# Patient Record
Sex: Female | Born: 1990 | Race: White | Hispanic: No | Marital: Married | Smoking: Never smoker
Health system: Southern US, Community
[De-identification: ages and names within clinical notes are randomized; demographics above are authoritative.]

## PROBLEM LIST (undated history)

## (undated) DIAGNOSIS — Z8759 Personal history of other complications of pregnancy, childbirth and the puerperium: Secondary | ICD-10-CM

## (undated) DIAGNOSIS — B019 Varicella without complication: Secondary | ICD-10-CM

## (undated) DIAGNOSIS — I10 Essential (primary) hypertension: Secondary | ICD-10-CM

## (undated) DIAGNOSIS — D649 Anemia, unspecified: Secondary | ICD-10-CM

## (undated) DIAGNOSIS — G43909 Migraine, unspecified, not intractable, without status migrainosus: Secondary | ICD-10-CM

## (undated) HISTORY — DX: Varicella without complication: B01.9

## (undated) HISTORY — DX: Essential (primary) hypertension: I10

## (undated) HISTORY — DX: Migraine, unspecified, not intractable, without status migrainosus: G43.909

---
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
## 2016-04-01 LAB — OB RESULTS CONSOLE HIV ANTIBODY (ROUTINE TESTING): HIV: NONREACTIVE

## 2016-04-01 LAB — OB RESULTS CONSOLE RPR: RPR: NONREACTIVE

## 2016-04-01 LAB — OB RESULTS CONSOLE GC/CHLAMYDIA
Chlamydia: NEGATIVE
Gonorrhea: NEGATIVE

## 2016-04-01 LAB — OB RESULTS CONSOLE RUBELLA ANTIBODY, IGM: Rubella: IMMUNE

## 2016-04-01 LAB — OB RESULTS CONSOLE HEPATITIS B SURFACE ANTIGEN: HEP B S AG: NEGATIVE

## 2016-09-30 ENCOUNTER — Encounter (HOSPITAL_COMMUNITY): Payer: Self-pay | Admitting: *Deleted

## 2016-09-30 ENCOUNTER — Observation Stay (HOSPITAL_COMMUNITY)
Admission: AD | Admit: 2016-09-30 | Discharge: 2016-10-01 | Disposition: A | Discharge status: Home / Self Care | Payer: 59 | Source: Ambulatory Visit | Attending: Obstetrics and Gynecology | Admitting: Obstetrics and Gynecology

## 2016-09-30 DIAGNOSIS — O139 Gestational [pregnancy-induced] hypertension without significant proteinuria, unspecified trimester: Secondary | ICD-10-CM | POA: Diagnosis present

## 2016-09-30 DIAGNOSIS — O133 Gestational [pregnancy-induced] hypertension without significant proteinuria, third trimester: Principal | ICD-10-CM

## 2016-09-30 DIAGNOSIS — Z3A34 34 weeks gestation of pregnancy: Secondary | ICD-10-CM | POA: Diagnosis not present

## 2016-09-30 DIAGNOSIS — O169 Unspecified maternal hypertension, unspecified trimester: Secondary | ICD-10-CM

## 2016-09-30 DIAGNOSIS — Z3689 Encounter for other specified antenatal screening: Secondary | ICD-10-CM

## 2016-09-30 HISTORY — DX: Anemia, unspecified: D64.9

## 2016-09-30 LAB — CBC WITH DIFFERENTIAL/PLATELET
Basophils Absolute: 0 10*3/uL (ref 0.0–0.1)
Basophils Relative: 0 %
EOS ABS: 0.2 10*3/uL (ref 0.0–0.7)
EOS PCT: 2 %
HCT: 33.9 % — ABNORMAL LOW (ref 36.0–46.0)
Hemoglobin: 11.7 g/dL — ABNORMAL LOW (ref 12.0–15.0)
LYMPHS ABS: 1.9 10*3/uL (ref 0.7–4.0)
LYMPHS PCT: 21 %
MCH: 28.7 pg (ref 26.0–34.0)
MCHC: 34.5 g/dL (ref 30.0–36.0)
MCV: 83.3 fL (ref 78.0–100.0)
MONO ABS: 0.4 10*3/uL (ref 0.1–1.0)
MONOS PCT: 5 %
Neutro Abs: 6.7 10*3/uL (ref 1.7–7.7)
Neutrophils Relative %: 72 %
Platelets: 279 10*3/uL (ref 150–400)
RBC: 4.07 MIL/uL (ref 3.87–5.11)
RDW: 13 % (ref 11.5–15.5)
WBC: 9.1 10*3/uL (ref 4.0–10.5)

## 2016-09-30 LAB — COMPREHENSIVE METABOLIC PANEL
ALT: 15 U/L (ref 14–54)
ANION GAP: 9 (ref 5–15)
AST: 33 U/L (ref 15–41)
Albumin: 3.1 g/dL — ABNORMAL LOW (ref 3.5–5.0)
Alkaline Phosphatase: 132 U/L — ABNORMAL HIGH (ref 38–126)
BUN: 6 mg/dL (ref 6–20)
CHLORIDE: 105 mmol/L (ref 101–111)
CO2: 20 mmol/L — AB (ref 22–32)
CREATININE: 0.52 mg/dL (ref 0.44–1.00)
Calcium: 9.6 mg/dL (ref 8.9–10.3)
Glucose, Bld: 98 mg/dL (ref 65–99)
Potassium: 4.8 mmol/L (ref 3.5–5.1)
SODIUM: 134 mmol/L — AB (ref 135–145)
Total Bilirubin: 1.4 mg/dL — ABNORMAL HIGH (ref 0.3–1.2)
Total Protein: 6.9 g/dL (ref 6.5–8.1)

## 2016-09-30 LAB — URINALYSIS, ROUTINE W REFLEX MICROSCOPIC
BILIRUBIN URINE: NEGATIVE
GLUCOSE, UA: NEGATIVE mg/dL
HGB URINE DIPSTICK: NEGATIVE
KETONES UR: NEGATIVE mg/dL
NITRITE: NEGATIVE
PROTEIN: NEGATIVE mg/dL
Specific Gravity, Urine: 1.005 (ref 1.005–1.030)
pH: 7 (ref 5.0–8.0)

## 2016-09-30 LAB — PROTEIN / CREATININE RATIO, URINE
CREATININE, URINE: 30 mg/dL
Total Protein, Urine: <6 mg/dL

## 2016-09-30 LAB — OB RESULTS CONSOLE GBS: GBS: NEGATIVE

## 2016-09-30 LAB — ABO/RH: ABO/RH(D): O POS

## 2016-09-30 LAB — TYPE AND SCREEN
ABO/RH(D): O POS
ANTIBODY SCREEN: NEGATIVE

## 2016-09-30 MED ORDER — BETAMETHASONE SOD PHOS & ACET 6 (3-3) MG/ML IJ SUSP
12.0000 mg | Freq: Once | INTRAMUSCULAR | Status: AC
  Administered 2016-09-30: 12 mg via INTRAMUSCULAR
  Filled 2016-09-30: qty 2

## 2016-09-30 MED ORDER — BETAMETHASONE SOD PHOS & ACET 6 (3-3) MG/ML IJ SUSP
12.0000 mg | Freq: Once | INTRAMUSCULAR | Status: AC
  Administered 2016-10-01: 12 mg via INTRAMUSCULAR
  Filled 2016-09-30: qty 2

## 2016-09-30 MED ORDER — PRENATAL MULTIVITAMIN CH
1.0000 | ORAL_TABLET | Freq: Every day | ORAL | Status: DC
  Administered 2016-09-30: 1 via ORAL
  Filled 2016-09-30: qty 1

## 2016-09-30 MED ORDER — CALCIUM CARBONATE ANTACID 500 MG PO CHEW
2.0000 | CHEWABLE_TABLET | ORAL | Status: DC | PRN
  Administered 2016-10-01: 400 mg via ORAL
  Filled 2016-09-30: qty 2

## 2016-09-30 MED ORDER — DOCUSATE SODIUM 100 MG PO CAPS
100.0000 mg | ORAL_CAPSULE | Freq: Every day | ORAL | Status: DC
  Administered 2016-09-30: 100 mg via ORAL
  Filled 2016-09-30 (×2): qty 1

## 2016-09-30 MED ORDER — ACETAMINOPHEN 325 MG PO TABS: 650.0000 mg | ORAL_TABLET | ORAL | Status: DC | PRN

## 2016-09-30 MED ORDER — ZOLPIDEM TARTRATE 5 MG PO TABS: 5.0000 mg | ORAL_TABLET | Freq: Every evening | ORAL | Status: DC | PRN

## 2016-09-30 NOTE — MAU Note (Signed)
Pt was sent from Dr. Renaldo FiddlerAdkins office with BP at work by by onsite work clinic was 148/108, in dr. Isidore Moosffice it was 142/102.  Pt denies headaches or epigastric pain.  Pt states she has blurry vision which is normal for her.  Increased swelling hands, feet, lower legs.  Good fetal movement.  No vaginal bleeding or ROM.

## 2016-09-30 NOTE — Progress Notes (Signed)
Pt transported via w/c with friend to [facility] from 7pm-9pm. - okayed by Dr Renaldo FiddlerAdkins per pt.

## 2016-09-30 NOTE — H&P (Addendum)
Regina Higgins is a 26 y.o. female presenting for admission with elevated BP.  Was evaluated in office today with BP 146/102.  No HA, n/v or RUQ pain.  + FM.  No ctx, vb or lof.   OB History    Gravida Para Term Preterm AB Living   2       1 0   SAB TAB Ectopic Multiple Live Births     1           Past Medical History:  Diagnosis Date  . Anemia    Past Surgical History:  Procedure Laterality Date  . breast lump removed  08/2008   Rt breast   Family History: family history is not on file. Social History:  reports that she has never smoked. She has never used smokeless tobacco. She reports that she does not drink alcohol or use drugs.     Maternal Diabetes: No Genetic Screening: Declined Maternal Ultrasounds/Referrals: Normal Fetal Ultrasounds or other Referrals:  None Maternal Substance Abuse:  No Significant Maternal Medications:  None Significant Maternal Lab Results:  None Other Comments:  None  ROS History   Blood pressure 121/87, pulse 118, temperature 98.1 F (36.7 C), temperature source Oral, resp. rate 20, SpO2 98 %. Exam Physical Exam  Gen - NAD Abd - gravid, NT Ext - NT, minimal edema bilat PV - deferred Prenatal labs: ABO, Rh:  O+ Antibody:  neg Rubella:  imm RPR:   neg HBsAg:   neg HIV:   neg GBS:     Results for orders placed or performed during the hospital encounter of 09/30/16 (from the past 24 hour(s))  Urinalysis, Routine w reflex microscopic     Status: Abnormal   Collection Time: 09/30/16 12:35 PM  Result Value Ref Range   Color, Urine YELLOW YELLOW   APPearance CLOUDY (A) CLEAR   Specific Gravity, Urine 1.005 1.005 - 1.030   pH 7.0 5.0 - 8.0   Glucose, UA NEGATIVE NEGATIVE mg/dL   Hgb urine dipstick NEGATIVE NEGATIVE   Bilirubin Urine NEGATIVE NEGATIVE   Ketones, ur NEGATIVE NEGATIVE mg/dL   Protein, ur NEGATIVE NEGATIVE mg/dL   Nitrite NEGATIVE NEGATIVE   Leukocytes, UA TRACE (A) NEGATIVE   RBC / HPF 0-5 0 - 5 RBC/hpf   WBC,  UA 6-30 0 - 5 WBC/hpf   Bacteria, UA MANY (A) NONE SEEN   Squamous Epithelial / LPF 6-30 (A) NONE SEEN   Mucous PRESENT   Protein / creatinine ratio, urine     Status: None   Collection Time: 09/30/16 12:35 PM  Result Value Ref Range   Creatinine, Urine 30.00 mg/dL   Total Protein, Urine <6 mg/dL   Protein Creatinine Ratio        0.00 - 0.15 mg/mg[Cre]  CBC with Differential     Status: Abnormal   Collection Time: 09/30/16  1:15 PM  Result Value Ref Range   WBC 9.1 4.0 - 10.5 K/uL   RBC 4.07 3.87 - 5.11 MIL/uL   Hemoglobin 11.7 (L) 12.0 - 15.0 g/dL   HCT 16.133.9 (L) 09.636.0 - 04.546.0 %   MCV 83.3 78.0 - 100.0 fL   MCH 28.7 26.0 - 34.0 pg   MCHC 34.5 30.0 - 36.0 g/dL   RDW 40.913.0 81.111.5 - 91.415.5 %   Platelets 279 150 - 400 K/uL   Neutrophils Relative % 72 %   Neutro Abs 6.7 1.7 - 7.7 K/uL   Lymphocytes Relative 21 %   Lymphs Abs 1.9 0.7 -  4.0 K/uL   Monocytes Relative 5 %   Monocytes Absolute 0.4 0.1 - 1.0 K/uL   Eosinophils Relative 2 %   Eosinophils Absolute 0.2 0.0 - 0.7 K/uL   Basophils Relative 0 %   Basophils Absolute 0.0 0.0 - 0.1 K/uL  Comprehensive metabolic panel     Status: Abnormal   Collection Time: 09/30/16  1:15 PM  Result Value Ref Range   Sodium 134 (L) 135 - 145 mmol/L   Potassium 4.8 3.5 - 5.1 mmol/L   Chloride 105 101 - 111 mmol/L   CO2 20 (L) 22 - 32 mmol/L   Glucose, Bld 98 65 - 99 mg/dL   BUN 6 6 - 20 mg/dL   Creatinine, Ser 1.61 0.44 - 1.00 mg/dL   Calcium 9.6 8.9 - 09.6 mg/dL   Total Protein 6.9 6.5 - 8.1 g/dL   Albumin 3.1 (L) 3.5 - 5.0 g/dL   AST 33 15 - 41 U/L   ALT 15 14 - 54 U/L   Alkaline Phosphatase 132 (H) 38 - 126 U/L   Total Bilirubin 1.4 (H) 0.3 - 1.2 mg/dL   GFR calc non Af Amer >60 >60 mL/min   GFR calc Af Amer >60 >60 mL/min   Anion gap 9 5 - 15    Assessment/Plan: 34+1 wks with GHTN, BP mild range Will admit for obs overnight Start BMZ series   Regina Higgins 09/30/2016, 3:00 PM

## 2016-09-30 NOTE — MAU Provider Note (Signed)
History     CSN: [id number]  Arrival date and time: 09/30/16 1229   First Provider Initiated Contact with Patient 09/30/16 1335      Chief Complaint  Patient presents with  . Hypertension   HPI   [name] is a 26 y.o. female G2P0010 @ 34w1dhere in MAU with elevated BP readings. She was seen in the office today because she felt "off". She was told that her BP was high and that she needed to come here for lab work.   She denies abdominal pain /contraction pain. Denies vaginal bleeding  + fetal heart tones   OB History    Gravida Para Term Preterm AB Living   2       1 0   SAB TAB Ectopic Multiple Live Births     1            Past Medical History:  Diagnosis Date  . Anemia     Past Surgical History:  Procedure Laterality Date  . breast lump removed  08/2008   Rt breast    History reviewed. No pertinent family history.  Social History  Substance Use Topics  . Smoking status: Never Smoker  . Smokeless tobacco: Never Used  . Alcohol use No    Allergies: No Known Allergies  No prescriptions prior to admission.   Results for orders placed or performed during the hospital encounter of 09/30/16 (from the past 48 hour(s))  Urinalysis, Routine w reflex microscopic     Status: Abnormal   Collection Time: 09/30/16 12:35 PM  Result Value Ref Range   Color, Urine YELLOW YELLOW   APPearance CLOUDY (A) CLEAR   Specific Gravity, Urine 1.005 1.005 - 1.030   pH 7.0 5.0 - 8.0   Glucose, UA NEGATIVE NEGATIVE mg/dL   Hgb urine dipstick NEGATIVE NEGATIVE   Bilirubin Urine NEGATIVE NEGATIVE   Ketones, ur NEGATIVE NEGATIVE mg/dL   Protein, ur NEGATIVE NEGATIVE mg/dL   Nitrite NEGATIVE NEGATIVE   Leukocytes, UA TRACE (A) NEGATIVE   RBC / HPF 0-5 0 - 5 RBC/hpf   WBC, UA 6-30 0 - 5 WBC/hpf   Bacteria, UA MANY (A) NONE SEEN   Squamous Epithelial / LPF 6-30 (A) NONE SEEN   Mucous PRESENT   Protein / creatinine ratio, urine     Status: None   Collection Time:  09/30/16 12:35 PM  Result Value Ref Range   Creatinine, Urine 30.00 mg/dL   Total Protein, Urine <6 mg/dL    Comment: REPEATED TO VERIFY NO NORMAL RANGE ESTABLISHED FOR THIS TEST    Protein Creatinine Ratio        0.00 - 0.15 mg/mg[Cre]    Comment: RESULT BELOW REPORTABLE RANGE, UNABLE TO CALCULATE.   CBC with Differential     Status: Abnormal   Collection Time: 09/30/16  1:15 PM  Result Value Ref Range   WBC 9.1 4.0 - 10.5 K/uL   RBC 4.07 3.87 - 5.11 MIL/uL   Hemoglobin 11.7 (L) 12.0 - 15.0 g/dL   HCT 33.9 (L) 36.0 - 46.0 %   MCV 83.3 78.0 - 100.0 fL   MCH 28.7 26.0 - 34.0 pg   MCHC 34.5 30.0 - 36.0 g/dL   RDW 13.0 11.5 - 15.5 %   Platelets 279 150 - 400 K/uL   Neutrophils Relative % 72 %   Neutro Abs 6.7 1.7 - 7.7 K/uL   Lymphocytes Relative 21 %   Lymphs Abs 1.9 0.7 - 4.0 K/uL  Monocytes Relative 5 %   Monocytes Absolute 0.4 0.1 - 1.0 K/uL   Eosinophils Relative 2 %   Eosinophils Absolute 0.2 0.0 - 0.7 K/uL   Basophils Relative 0 %   Basophils Absolute 0.0 0.0 - 0.1 K/uL  Comprehensive metabolic panel     Status: Abnormal   Collection Time: 09/30/16  1:15 PM  Result Value Ref Range   Sodium 134 (L) 135 - 145 mmol/L   Potassium 4.8 3.5 - 5.1 mmol/L   Chloride 105 101 - 111 mmol/L   CO2 20 (L) 22 - 32 mmol/L   Glucose, Bld 98 65 - 99 mg/dL   BUN 6 6 - 20 mg/dL   Creatinine, Ser 0.52 0.44 - 1.00 mg/dL   Calcium 9.6 8.9 - 10.3 mg/dL   Total Protein 6.9 6.5 - 8.1 g/dL   Albumin 3.1 (L) 3.5 - 5.0 g/dL   AST 33 15 - 41 U/L   ALT 15 14 - 54 U/L   Alkaline Phosphatase 132 (H) 38 - 126 U/L   Total Bilirubin 1.4 (H) 0.3 - 1.2 mg/dL   GFR calc non Af Amer >60 >60 mL/min   GFR calc Af Amer >60 >60 mL/min    Comment: (NOTE) The eGFR has been calculated using the CKD EPI equation. This calculation has not been validated in all clinical situations. eGFR's persistently <60 mL/min signify possible Chronic Kidney Disease.    Anion gap 9 5 - 15    Review of Systems   Eyes: Positive for visual disturbance (Patient has glasses however feels she is having trouble focusing. No spots ).  Gastrointestinal: Negative for abdominal pain.  Genitourinary: Negative for dysuria, frequency and urgency.  Neurological: Negative for headaches.   Physical Exam   Blood pressure (!) 149/54, pulse 116, temperature 98.1 F (36.7 C), temperature source Oral, resp. rate 20, SpO2 98 %.  Patient Vitals for the past 24 hrs:  BP Temp Temp src Pulse Resp SpO2  09/30/16 1500 (!) 154/95 98.9 F (37.2 C) Oral (!) 115 18 99 %  09/30/16 1428 121/87 - - 118 - -  09/30/16 1413 (!) 149/54 - - 116 - -  09/30/16 1359 141/85 - - 116 - 98 %  09/30/16 1343 141/86 - - 117 - -  09/30/16 1329 - - - (!) 122 - -  09/30/16 1328 137/76 - - 114 - -  09/30/16 1313 139/94 - - (!) 126 - -  09/30/16 1258 149/88 - - 115 - -  09/30/16 1245 149/93 98.1 F (36.7 C) Oral (!) 125 20 98 %    Physical Exam  Constitutional: She is oriented to person, place, and time. She appears well-developed and well-nourished. No distress.  HENT:  Head: Normocephalic.  Eyes: Pupils are equal, round, and reactive to light.  Cardiovascular: Normal rate.   Respiratory: Effort normal and breath sounds normal. No respiratory distress. She has no wheezes.  GI: Soft.  Musculoskeletal: Normal range of motion.  Neurological: She is alert and oriented to person, place, and time. She has normal reflexes. She displays normal reflexes.  Negative clonus   Skin: Skin is warm. She is not diaphoretic.  Psychiatric: Her behavior is normal.   Fetal Tracing: Baseline: 130 bpm  Variability: moderate  Accelerations: 15x15 Decelerations: none Toco: Occasional contraction. Patient denies pain.   MAU Course  Procedures  none  MDM  PIH labs Discussed patient with Dr. Julien Girt @ 1430 Betamethasone   Assessment and Plan   A:  1. Pregnancy-induced  hypertension in third trimester     P:  Admit to [facility] for 24 hour  observation  Betamethasone   Lezlie Lye, NP 09/30/2016 3:17 PM

## 2016-10-01 ENCOUNTER — Encounter (HOSPITAL_COMMUNITY): Payer: Self-pay | Admitting: *Deleted

## 2016-10-01 ENCOUNTER — Observation Stay (HOSPITAL_COMMUNITY): Payer: 59

## 2016-10-01 LAB — COMPREHENSIVE METABOLIC PANEL
ALBUMIN: 2.9 g/dL — AB (ref 3.5–5.0)
ALK PHOS: 123 U/L (ref 38–126)
ALT: 16 U/L (ref 14–54)
AST: 15 U/L (ref 15–41)
Anion gap: 8 (ref 5–15)
BUN: 10 mg/dL (ref 6–20)
CALCIUM: 8.9 mg/dL (ref 8.9–10.3)
CO2: 19 mmol/L — AB (ref 22–32)
CREATININE: 0.48 mg/dL (ref 0.44–1.00)
Chloride: 106 mmol/L (ref 101–111)
GFR calc Af Amer: >60 mL/min (ref >60)
GFR calc non Af Amer: >60 mL/min (ref >60)
GLUCOSE: 142 mg/dL — AB (ref 65–99)
Potassium: 3.9 mmol/L (ref 3.5–5.1)
SODIUM: 133 mmol/L — AB (ref 135–145)
Total Bilirubin: 0.5 mg/dL (ref 0.3–1.2)
Total Protein: 6.1 g/dL — ABNORMAL LOW (ref 6.5–8.1)

## 2016-10-01 LAB — CBC
HCT: 31.8 % — ABNORMAL LOW (ref 36.0–46.0)
HEMOGLOBIN: 11 g/dL — AB (ref 12.0–15.0)
MCH: 28.6 pg (ref 26.0–34.0)
MCHC: 34.6 g/dL (ref 30.0–36.0)
MCV: 82.6 fL (ref 78.0–100.0)
Platelets: 255 10*3/uL (ref 150–400)
RBC: 3.85 MIL/uL — ABNORMAL LOW (ref 3.87–5.11)
RDW: 13.2 % (ref 11.5–15.5)
WBC: 11.6 10*3/uL — ABNORMAL HIGH (ref 4.0–10.5)

## 2016-10-01 LAB — CULTURE, OB URINE: Special Requests: NORMAL

## 2016-10-01 NOTE — Discharge Summary (Signed)
Physician Discharge Summary  Patient ID: Regina Higgins MRN: [id number] DOB/AGE: [birth year] 25 y.o.  Admit date: 09/30/2016 Discharge date: 10/01/2016  Admission Diagnoses:3460w2d, Gest HTN   Discharge Diagnoses:  Active Problems:   Gestational hypertension   Discharged Condition: good  Hospital Course: adm for eva; + BMZ, D/C within 24 hrs with nl PIH  labs  Consults: None  Significant Diagnostic Studies: labs:  Results for orders placed or performed during the hospital encounter of 09/30/16 (from the past 24 hour(s))  Urinalysis, Routine w reflex microscopic     Status: Abnormal   Collection Time: 09/30/16 12:35 PM  Result Value Ref Range   Color, Urine YELLOW YELLOW   APPearance CLOUDY (A) CLEAR   Specific Gravity, Urine 1.005 1.005 - 1.030   pH 7.0 5.0 - 8.0   Glucose, UA NEGATIVE NEGATIVE mg/dL   Hgb urine dipstick NEGATIVE NEGATIVE   Bilirubin Urine NEGATIVE NEGATIVE   Ketones, ur NEGATIVE NEGATIVE mg/dL   Protein, ur NEGATIVE NEGATIVE mg/dL   Nitrite NEGATIVE NEGATIVE   Leukocytes, UA TRACE (A) NEGATIVE   RBC / HPF 0-5 0 - 5 RBC/hpf   WBC, UA 6-30 0 - 5 WBC/hpf   Bacteria, UA MANY (A) NONE SEEN   Squamous Epithelial / LPF 6-30 (A) NONE SEEN   Mucous PRESENT   Protein / creatinine ratio, urine     Status: None   Collection Time: 09/30/16 12:35 PM  Result Value Ref Range   Creatinine, Urine 30.00 mg/dL   Total Protein, Urine <6 mg/dL   Protein Creatinine Ratio        0.00 - 0.15 mg/mg[Cre]  CBC with Differential     Status: Abnormal   Collection Time: 09/30/16  1:15 PM  Result Value Ref Range   WBC 9.1 4.0 - 10.5 K/uL   RBC 4.07 3.87 - 5.11 MIL/uL   Hemoglobin 11.7 (L) 12.0 - 15.0 g/dL   HCT 40.933.9 (L) 81.136.0 - 91.446.0 %   MCV 83.3 78.0 - 100.0 fL   MCH 28.7 26.0 - 34.0 pg   MCHC 34.5 30.0 - 36.0 g/dL   RDW 78.213.0 95.611.5 - 21.315.5 %   Platelets 279 150 - 400 K/uL   Neutrophils Relative % 72 %   Neutro Abs 6.7 1.7 - 7.7 K/uL   Lymphocytes Relative 21 %   Lymphs Abs  1.9 0.7 - 4.0 K/uL   Monocytes Relative 5 %   Monocytes Absolute 0.4 0.1 - 1.0 K/uL   Eosinophils Relative 2 %   Eosinophils Absolute 0.2 0.0 - 0.7 K/uL   Basophils Relative 0 %   Basophils Absolute 0.0 0.0 - 0.1 K/uL  Comprehensive metabolic panel     Status: Abnormal   Collection Time: 09/30/16  1:15 PM  Result Value Ref Range   Sodium 134 (L) 135 - 145 mmol/L   Potassium 4.8 3.5 - 5.1 mmol/L   Chloride 105 101 - 111 mmol/L   CO2 20 (L) 22 - 32 mmol/L   Glucose, Bld 98 65 - 99 mg/dL   BUN 6 6 - 20 mg/dL   Creatinine, Ser 0.860.52 0.44 - 1.00 mg/dL   Calcium 9.6 8.9 - 57.810.3 mg/dL   Total Protein 6.9 6.5 - 8.1 g/dL   Albumin 3.1 (L) 3.5 - 5.0 g/dL   AST 33 15 - 41 U/L   ALT 15 14 - 54 U/L   Alkaline Phosphatase 132 (H) 38 - 126 U/L   Total Bilirubin 1.4 (H) 0.3 - 1.2 mg/dL   GFR  calc non Af Amer >60 >60 mL/min   GFR calc Af Amer >60 >60 mL/min   Anion gap 9 5 - 15  Type and screen [facility]     Status: None   Collection Time: 09/30/16  3:15 PM  Result Value Ref Range   ABO/RH(D) O POS    Antibody Screen NEG    Sample Expiration 10/03/2016   ABO/Rh     Status: None   Collection Time: 09/30/16  3:15 PM  Result Value Ref Range   ABO/RH(D) O POS   CBC     Status: Abnormal   Collection Time: 10/01/16  6:22 AM  Result Value Ref Range   WBC 11.6 (H) 4.0 - 10.5 K/uL   RBC 3.85 (L) 3.87 - 5.11 MIL/uL   Hemoglobin 11.0 (L) 12.0 - 15.0 g/dL   HCT 16.1 (L) 09.6 - 04.5 %   MCV 82.6 78.0 - 100.0 fL   MCH 28.6 26.0 - 34.0 pg   MCHC 34.6 30.0 - 36.0 g/dL   RDW 40.9 81.1 - 91.4 %   Platelets 255 150 - 400 K/uL  Comprehensive metabolic panel     Status: Abnormal   Collection Time: 10/01/16  6:22 AM  Result Value Ref Range   Sodium 133 (L) 135 - 145 mmol/L   Potassium 3.9 3.5 - 5.1 mmol/L   Chloride 106 101 - 111 mmol/L   CO2 19 (L) 22 - 32 mmol/L   Glucose, Bld 142 (H) 65 - 99 mg/dL   BUN 10 6 - 20 mg/dL   Creatinine, Ser 7.82 0.44 - 1.00 mg/dL   Calcium 8.9  8.9 - 95.6 mg/dL   Total Protein 6.1 (L) 6.5 - 8.1 g/dL   Albumin 2.9 (L) 3.5 - 5.0 g/dL   AST 15 15 - 41 U/L   ALT 16 14 - 54 U/L   Alkaline Phosphatase 123 38 - 126 U/L   Total Bilirubin 0.5 0.3 - 1.2 mg/dL   GFR calc non Af Amer >60 >60 mL/min   GFR calc Af Amer >60 >60 mL/min   Anion gap 8 5 - 15    Treatments: IV hydration and BMZ  Discharge Exam: fhr 142  Disposition: Final discharge disposition not confirmed   Allergies as of 10/01/2016   No Known Allergies     Medication List    TAKE these medications   calcium carbonate 500 MG chewable tablet Commonly known as:  TUMS - dosed in mg elemental calcium Chew 1 tablet by mouth 3 (three) times daily as needed for indigestion or heartburn.   prenatal multivitamin Tabs tablet Take 1 tablet by mouth at bedtime.      Follow-up Information    [facility] Follow up in 3 day(s).   Why:  office will call Contact information: [address] [place] [address] [place] [address] [phone]           Signed: Meriel Pica 10/01/2016, 8:49 AM

## 2016-10-01 NOTE — Progress Notes (Signed)
[redacted]w[redacted]d   S// no new c/o O// BP 123/84 (BP Location: Right Arm)   Pulse (!) 101   Temp 97.5 F (36.4 C) (Oral)   Resp 20   Ht 5\' 4"  (1.626 m)   Wt 117.9 kg (260 lb)   SpO2 98%   BMI 44.63 kg/m   Results for orders placed or performed during the hospital encounter of 09/30/16 (from the past 24 hour(s))  Urinalysis, Routine w reflex microscopic     Status: Abnormal   Collection Time: 09/30/16 12:35 PM  Result Value Ref Range   Color, Urine YELLOW YELLOW   APPearance CLOUDY (A) CLEAR   Specific Gravity, Urine 1.005 1.005 - 1.030   pH 7.0 5.0 - 8.0   Glucose, UA NEGATIVE NEGATIVE mg/dL   Hgb urine dipstick NEGATIVE NEGATIVE   Bilirubin Urine NEGATIVE NEGATIVE   Ketones, ur NEGATIVE NEGATIVE mg/dL   Protein, ur NEGATIVE NEGATIVE mg/dL   Nitrite NEGATIVE NEGATIVE   Leukocytes, UA TRACE (A) NEGATIVE   RBC / HPF 0-5 0 - 5 RBC/hpf   WBC, UA 6-30 0 - 5 WBC/hpf   Bacteria, UA MANY (A) NONE SEEN   Squamous Epithelial / LPF 6-30 (A) NONE SEEN   Mucous PRESENT   Protein / creatinine ratio, urine     Status: None   Collection Time: 09/30/16 12:35 PM  Result Value Ref Range   Creatinine, Urine 30.00 mg/dL   Total Protein, Urine <6 mg/dL   Protein Creatinine Ratio        0.00 - 0.15 mg/mg[Cre]  CBC with Differential     Status: Abnormal   Collection Time: 09/30/16  1:15 PM  Result Value Ref Range   WBC 9.1 4.0 - 10.5 K/uL   RBC 4.07 3.87 - 5.11 MIL/uL   Hemoglobin 11.7 (L) 12.0 - 15.0 g/dL   HCT 16.1 (L) 09.6 - 04.5 %   MCV 83.3 78.0 - 100.0 fL   MCH 28.7 26.0 - 34.0 pg   MCHC 34.5 30.0 - 36.0 g/dL   RDW 40.9 81.1 - 91.4 %   Platelets 279 150 - 400 K/uL   Neutrophils Relative % 72 %   Neutro Abs 6.7 1.7 - 7.7 K/uL   Lymphocytes Relative 21 %   Lymphs Abs 1.9 0.7 - 4.0 K/uL   Monocytes Relative 5 %   Monocytes Absolute 0.4 0.1 - 1.0 K/uL   Eosinophils Relative 2 %   Eosinophils Absolute 0.2 0.0 - 0.7 K/uL   Basophils Relative 0 %   Basophils Absolute 0.0 0.0 - 0.1 K/uL   Comprehensive metabolic panel     Status: Abnormal   Collection Time: 09/30/16  1:15 PM  Result Value Ref Range   Sodium 134 (L) 135 - 145 mmol/L   Potassium 4.8 3.5 - 5.1 mmol/L   Chloride 105 101 - 111 mmol/L   CO2 20 (L) 22 - 32 mmol/L   Glucose, Bld 98 65 - 99 mg/dL   BUN 6 6 - 20 mg/dL   Creatinine, Ser 7.82 0.44 - 1.00 mg/dL   Calcium 9.6 8.9 - 95.6 mg/dL   Total Protein 6.9 6.5 - 8.1 g/dL   Albumin 3.1 (L) 3.5 - 5.0 g/dL   AST 33 15 - 41 U/L   ALT 15 14 - 54 U/L   Alkaline Phosphatase 132 (H) 38 - 126 U/L   Total Bilirubin 1.4 (H) 0.3 - 1.2 mg/dL   GFR calc non Af Amer >60 >60 mL/min   GFR calc Af  Amer >60 >60 mL/min   Anion gap 9 5 - 15  Type and screen [facility]     Status: None   Collection Time: 09/30/16  3:15 PM  Result Value Ref Range   ABO/RH(D) O POS    Antibody Screen NEG    Sample Expiration 10/03/2016   ABO/Rh     Status: None   Collection Time: 09/30/16  3:15 PM  Result Value Ref Range   ABO/RH(D) O POS   CBC     Status: Abnormal   Collection Time: 10/01/16  6:22 AM  Result Value Ref Range   WBC 11.6 (H) 4.0 - 10.5 K/uL   RBC 3.85 (L) 3.87 - 5.11 MIL/uL   Hemoglobin 11.0 (L) 12.0 - 15.0 g/dL   HCT 54.031.8 (L) 98.136.0 - 19.146.0 %   MCV 82.6 78.0 - 100.0 fL   MCH 28.6 26.0 - 34.0 pg   MCHC 34.6 30.0 - 36.0 g/dL   RDW 47.813.2 29.511.5 - 62.115.5 %   Platelets 255 150 - 400 K/uL  Comprehensive metabolic panel     Status: Abnormal   Collection Time: 10/01/16  6:22 AM  Result Value Ref Range   Sodium 133 (L) 135 - 145 mmol/L   Potassium 3.9 3.5 - 5.1 mmol/L   Chloride 106 101 - 111 mmol/L   CO2 19 (L) 22 - 32 mmol/L   Glucose, Bld 142 (H) 65 - 99 mg/dL   BUN 10 6 - 20 mg/dL   Creatinine, Ser 3.080.48 0.44 - 1.00 mg/dL   Calcium 8.9 8.9 - 65.710.3 mg/dL   Total Protein 6.1 (L) 6.5 - 8.1 g/dL   Albumin 2.9 (L) 3.5 - 5.0 g/dL   AST 15 15 - 41 U/L   ALT 16 14 - 54 U/L   Alkaline Phosphatase 123 38 - 126 U/L   Total Bilirubin 0.5 0.3 - 1.2 mg/dL   GFR  calc non Af Amer >60 >60 mL/min   GFR calc Af Amer >60 >60 mL/min   Anion gap 8 5 - 15    A+P// 1872w2d             Gest HTN, stable , will plan D/C after second BMZ today, f/u office 2-3 days

## 2016-10-02 NOTE — Progress Notes (Signed)
1600- Pt given discharge instructions and follow-up appointment. Pre-eclampsia s/s reviewed with patient and patient verbalizes understanding.

## 2016-10-14 ENCOUNTER — Inpatient Hospital Stay (HOSPITAL_COMMUNITY)
Admission: AD | Admit: 2016-10-14 | Discharge: 2016-10-19 | DRG: 765 | Disposition: A | Discharge status: Home / Self Care | Payer: 59 | Source: Ambulatory Visit | Attending: Obstetrics and Gynecology | Admitting: Obstetrics and Gynecology

## 2016-10-14 ENCOUNTER — Encounter (HOSPITAL_COMMUNITY): Payer: Self-pay | Admitting: *Deleted

## 2016-10-14 DIAGNOSIS — Z8249 Family history of ischemic heart disease and other diseases of the circulatory system: Secondary | ICD-10-CM

## 2016-10-14 DIAGNOSIS — O133 Gestational [pregnancy-induced] hypertension without significant proteinuria, third trimester: Secondary | ICD-10-CM

## 2016-10-14 DIAGNOSIS — O99214 Obesity complicating childbirth: Secondary | ICD-10-CM | POA: Diagnosis present

## 2016-10-14 DIAGNOSIS — Z3A36 36 weeks gestation of pregnancy: Secondary | ICD-10-CM | POA: Diagnosis not present

## 2016-10-14 DIAGNOSIS — O139 Gestational [pregnancy-induced] hypertension without significant proteinuria, unspecified trimester: Secondary | ICD-10-CM | POA: Diagnosis present

## 2016-10-14 DIAGNOSIS — O1490 Unspecified pre-eclampsia, unspecified trimester: Secondary | ICD-10-CM | POA: Diagnosis present

## 2016-10-14 DIAGNOSIS — O1494 Unspecified pre-eclampsia, complicating childbirth: Principal | ICD-10-CM | POA: Diagnosis present

## 2016-10-14 DIAGNOSIS — Z6841 Body Mass Index (BMI) 40.0 and over, adult: Secondary | ICD-10-CM

## 2016-10-14 DIAGNOSIS — R030 Elevated blood-pressure reading, without diagnosis of hypertension: Secondary | ICD-10-CM | POA: Diagnosis present

## 2016-10-14 LAB — URINALYSIS, ROUTINE W REFLEX MICROSCOPIC
BILIRUBIN URINE: NEGATIVE
GLUCOSE, UA: NEGATIVE mg/dL
Hgb urine dipstick: NEGATIVE
KETONES UR: 5 mg/dL — AB
Leukocytes, UA: NEGATIVE
Nitrite: NEGATIVE
Protein, ur: NEGATIVE mg/dL
SPECIFIC GRAVITY, URINE: 1.016 (ref 1.005–1.030)
pH: 5 (ref 5.0–8.0)

## 2016-10-14 LAB — CBC
HCT: 31.4 % — ABNORMAL LOW (ref 36.0–46.0)
HCT: 34.5 % — ABNORMAL LOW (ref 36.0–46.0)
HEMATOCRIT: 33 % — AB (ref 36.0–46.0)
HEMOGLOBIN: 11.8 g/dL — AB (ref 12.0–15.0)
Hemoglobin: 11.1 g/dL — ABNORMAL LOW (ref 12.0–15.0)
Hemoglobin: 11.9 g/dL — ABNORMAL LOW (ref 12.0–15.0)
MCH: 28.9 pg (ref 26.0–34.0)
MCH: 29.2 pg (ref 26.0–34.0)
MCH: 28.5 pg (ref 26.0–34.0)
MCHC: 35.8 g/dL (ref 30.0–36.0)
MCHC: 35.4 g/dL (ref 30.0–36.0)
MCHC: 34.5 g/dL (ref 30.0–36.0)
MCV: 80.9 fL (ref 78.0–100.0)
MCV: 82.6 fL (ref 78.0–100.0)
MCV: 82.7 fL (ref 78.0–100.0)
PLATELETS: 240 10*3/uL (ref 150–400)
PLATELETS: 263 10*3/uL (ref 150–400)
Platelets: 275 10*3/uL (ref 150–400)
RBC: 4.08 MIL/uL (ref 3.87–5.11)
RBC: 3.8 MIL/uL — ABNORMAL LOW (ref 3.87–5.11)
RBC: 4.17 MIL/uL (ref 3.87–5.11)
RDW: 13.3 % (ref 11.5–15.5)
RDW: 13.3 % (ref 11.5–15.5)
RDW: 13.4 % (ref 11.5–15.5)
WBC: 8.6 10*3/uL (ref 4.0–10.5)
WBC: 9.1 10*3/uL (ref 4.0–10.5)
WBC: 9.9 10*3/uL (ref 4.0–10.5)

## 2016-10-14 LAB — COMPREHENSIVE METABOLIC PANEL
ALK PHOS: 163 U/L — AB (ref 38–126)
ALT: 14 U/L (ref 14–54)
ALT: 15 U/L (ref 14–54)
ANION GAP: 10 (ref 5–15)
AST: 14 U/L — AB (ref 15–41)
AST: 16 U/L (ref 15–41)
Albumin: 2.9 g/dL — ABNORMAL LOW (ref 3.5–5.0)
Albumin: 2.5 g/dL — ABNORMAL LOW (ref 3.5–5.0)
Alkaline Phosphatase: 133 U/L — ABNORMAL HIGH (ref 38–126)
Anion gap: 6 (ref 5–15)
BILIRUBIN TOTAL: 0.4 mg/dL (ref 0.3–1.2)
BUN: 10 mg/dL (ref 6–20)
BUN: 11 mg/dL (ref 6–20)
CALCIUM: 8.2 mg/dL — AB (ref 8.9–10.3)
CALCIUM: 8.9 mg/dL (ref 8.9–10.3)
CO2: 18 mmol/L — ABNORMAL LOW (ref 22–32)
CO2: 21 mmol/L — ABNORMAL LOW (ref 22–32)
CREATININE: 0.58 mg/dL (ref 0.44–1.00)
Chloride: 106 mmol/L (ref 101–111)
Chloride: 106 mmol/L (ref 101–111)
Creatinine, Ser: 0.57 mg/dL (ref 0.44–1.00)
GFR calc Af Amer: >60 mL/min (ref >60)
GLUCOSE: 111 mg/dL — AB (ref 65–99)
Glucose, Bld: 95 mg/dL (ref 65–99)
POTASSIUM: 4 mmol/L (ref 3.5–5.1)
Potassium: 4 mmol/L (ref 3.5–5.1)
Sodium: 134 mmol/L — ABNORMAL LOW (ref 135–145)
Sodium: 133 mmol/L — ABNORMAL LOW (ref 135–145)
TOTAL PROTEIN: 5.7 g/dL — AB (ref 6.5–8.1)
TOTAL PROTEIN: 6.5 g/dL (ref 6.5–8.1)
Total Bilirubin: 0.5 mg/dL (ref 0.3–1.2)

## 2016-10-14 LAB — TYPE AND SCREEN
ABO/RH(D): O POS
ABO/RH(D): O POS
Antibody Screen: NEGATIVE
Antibody Screen: NEGATIVE

## 2016-10-14 LAB — PROTEIN / CREATININE RATIO, URINE
CREATININE, URINE: 119 mg/dL
PROTEIN CREATININE RATIO: 0.06 mg/mg{creat} (ref 0.00–0.15)
TOTAL PROTEIN, URINE: 7 mg/dL

## 2016-10-14 MED ORDER — MAGNESIUM SULFATE BOLUS VIA INFUSION
4.0000 g | Freq: Once | INTRAVENOUS | Status: AC
Start: 2016-10-14 — End: 2016-10-14
  Administered 2016-10-14: 4 g via INTRAVENOUS
  Filled 2016-10-14: qty 500

## 2016-10-14 MED ORDER — OXYCODONE-ACETAMINOPHEN 5-325 MG PO TABS: 2.0000 | ORAL_TABLET | ORAL | Status: DC | PRN

## 2016-10-14 MED ORDER — BUTALBITAL-APAP-CAFFEINE 50-325-40 MG PO TABS: 1.0000 | ORAL_TABLET | Freq: Once | ORAL | Status: DC

## 2016-10-14 MED ORDER — TERBUTALINE SULFATE 1 MG/ML IJ SOLN: 0.2500 mg | Freq: Once | INTRAMUSCULAR | Status: DC | PRN

## 2016-10-14 MED ORDER — ZOLPIDEM TARTRATE 5 MG PO TABS: 5.0000 mg | ORAL_TABLET | Freq: Every evening | ORAL | Status: DC | PRN

## 2016-10-14 MED ORDER — CALCIUM CARBONATE ANTACID 500 MG PO CHEW: 2.0000 | CHEWABLE_TABLET | ORAL | Status: DC | PRN

## 2016-10-14 MED ORDER — ACETAMINOPHEN 325 MG PO TABS: 650.0000 mg | ORAL_TABLET | ORAL | Status: DC | PRN

## 2016-10-14 MED ORDER — LACTATED RINGERS IV SOLN: 500.0000 mL | INTRAVENOUS | Status: DC | PRN

## 2016-10-14 MED ORDER — LACTATED RINGERS IV SOLN
INTRAVENOUS | Status: DC
  Administered 2016-10-14 – 2016-10-15 (×2): via INTRAVENOUS

## 2016-10-14 MED ORDER — MISOPROSTOL 25 MCG QUARTER TABLET
25.0000 ug | ORAL_TABLET | ORAL | Status: DC | PRN
  Administered 2016-10-14 – 2016-10-15 (×6): 25 ug via VAGINAL
  Filled 2016-10-14 (×5): qty 0.25

## 2016-10-14 MED ORDER — PRENATAL MULTIVITAMIN CH
1.0000 | ORAL_TABLET | Freq: Every day | ORAL | Status: DC
  Filled 2016-10-14: qty 1

## 2016-10-14 MED ORDER — ONDANSETRON HCL 4 MG/2ML IJ SOLN: 4.0000 mg | Freq: Four times a day (QID) | INTRAMUSCULAR | Status: DC | PRN

## 2016-10-14 MED ORDER — OXYCODONE-ACETAMINOPHEN 5-325 MG PO TABS: 1.0000 | ORAL_TABLET | ORAL | Status: DC | PRN

## 2016-10-14 MED ORDER — BUTALBITAL-APAP-CAFFEINE 50-325-40 MG PO TABS
1.0000 | ORAL_TABLET | Freq: Four times a day (QID) | ORAL | Status: DC | PRN
  Administered 2016-10-14 – 2016-10-18 (×8): 1 via ORAL
  Filled 2016-10-14 (×8): qty 1

## 2016-10-14 MED ORDER — FLEET ENEMA 7-19 GM/118ML RE ENEM: 1.0000 | ENEMA | RECTAL | Status: DC | PRN

## 2016-10-14 MED ORDER — DOCUSATE SODIUM 100 MG PO CAPS
100.0000 mg | ORAL_CAPSULE | Freq: Every day | ORAL | Status: DC
  Administered 2016-10-17 – 2016-10-19 (×3): 100 mg via ORAL
  Filled 2016-10-14 (×4): qty 1

## 2016-10-14 MED ORDER — OXYTOCIN BOLUS FROM INFUSION: 500.0000 mL | Freq: Once | INTRAVENOUS | Status: DC

## 2016-10-14 MED ORDER — PENICILLIN G POT IN DEXTROSE 60000 UNIT/ML IV SOLN
3.0000 10*6.[IU] | INTRAVENOUS | Status: DC
  Filled 2016-10-14 (×3): qty 50

## 2016-10-14 MED ORDER — PENICILLIN G POTASSIUM 5000000 UNITS IJ SOLR
5.0000 10*6.[IU] | Freq: Once | INTRAVENOUS | Status: DC
  Filled 2016-10-14: qty 5

## 2016-10-14 MED ORDER — SOD CITRATE-CITRIC ACID 500-334 MG/5ML PO SOLN
30.0000 mL | ORAL | Status: DC | PRN
  Administered 2016-10-16: 30 mL via ORAL
  Filled 2016-10-14: qty 15

## 2016-10-14 MED ORDER — OXYTOCIN 40 UNITS IN LACTATED RINGERS INFUSION - SIMPLE MED
2.5000 [IU]/h | INTRAVENOUS | Status: DC
  Filled 2016-10-14: qty 1000

## 2016-10-14 MED ORDER — MAGNESIUM SULFATE 50 % IJ SOLN
2.0000 g/h | INTRAVENOUS | Status: DC
  Administered 2016-10-16: 2 g/h via INTRAVENOUS
  Filled 2016-10-14 (×4): qty 80

## 2016-10-14 MED ORDER — FENTANYL CITRATE (PF) 100 MCG/2ML IJ SOLN: 50.0000 ug | INTRAMUSCULAR | Status: DC | PRN

## 2016-10-14 MED ORDER — ACETAMINOPHEN 325 MG PO TABS
650.0000 mg | ORAL_TABLET | ORAL | Status: DC | PRN
Start: 2016-10-14 — End: 2016-10-16

## 2016-10-14 MED ORDER — LIDOCAINE HCL (PF) 1 % IJ SOLN: 30.0000 mL | INTRAMUSCULAR | Status: DC | PRN

## 2016-10-14 NOTE — Progress Notes (Signed)
C/O persistent visual disturbance.  HA improved but continues to require medication to treat.  No CP/SOB, no RUQ pain.  Active FM.  BP 142/69 Pre-E labs wnl FHT Cat I  Gen: A&O x 3 Abd: soft, NT Ext: no c/c/e  26 yo G2P0010 at 5561w1d with pre-eclampsia with severe features -Move to L&D for IOL, VMP -Treat BP with IV meds prn -Plan of care discussed with patient  Mitchel HonourMegan Renita Brocks, DO

## 2016-10-14 NOTE — Anesthesia Pain Management Evaluation Note (Signed)
  [facility] Pain Management Visit Note  Patient: Regina Higgins, 26 y.o., female  "Hello I am a [facility]. We have an anesthesia team available at all times to provide care throughout the hospital, including epidural management and anesthesia for C-section. I don't know your plan for the delivery whether it a natural birth, water birth, IV sedation, nitrous supplementation, doula or epidural, but we want to meet your pain goals."   1.Was your pain managed to your expectations on prior hospitalizations?   No prior hospitalizations  2.What is your expectation for pain management during this hospitalization?     Epidural  3.How can we help you reach that goal? epidural  Record the patient's initial score and the patient's pain goal.   Pain: 0  Pain Goal: 4 [facility] wants you to be able to say your pain was always managed very well.  Regina Higgins 10/14/2016

## 2016-10-14 NOTE — Progress Notes (Addendum)
Assumed care of pt at this time. Pt resting comfortably. States Regina Higgins gone and that the fiorcet helped.   1410: Provider notified for GBS status. Orders received to perform rapid GBS culture.   1414: Provider called to unit. Report received that pt is GBS negative. Order received to not perform rapid GBS culture.   1427: MD in unit. Okayed for pt to have bedpan vs foley catheter  1441: pt eating lunch at this time

## 2016-10-14 NOTE — Progress Notes (Signed)
1500 RN discovered magnesium had been infusing at 4g/hr for an hour and a half.  Magnesium decreased to 2gm/hr when realized.  MD notified of medication error.

## 2016-10-14 NOTE — MAU Note (Signed)
Pt presents complaining of headache and visual changes that started yesterday and got worse tonight. States she sees floaters and her vision is blurry. Denies leaking or bleeding. Reports good fetal movement.

## 2016-10-14 NOTE — H&P (Addendum)
Merleen Picazo is a 26 y.o. female G2P0 @ 36+1 wks presenting for HA and blurred vision.  Pt was admitted 2 wks ago with elevated BP and s/p BMZ - being followed as outpatient.  No ctx, vb, or lof.   . OB History    Gravida Para Term Preterm AB Living   2       1 0   SAB TAB Ectopic Multiple Live Births     1           Past Medical History:  Diagnosis Date  . Anemia    Past Surgical History:  Procedure Laterality Date  . breast lump removed  08/2008   Rt breast   Family History: family history includes Hypertension in her mother. Social History:  reports that she has never smoked. She has never used smokeless tobacco. She reports that she does not drink alcohol or use drugs.       ROS History   Blood pressure (!) 127/52, pulse 109, temperature 98.4 F (36.9 C), temperature source Oral, resp. rate 16, height 5\' 4"  (1.626 m), weight 261 lb (118.4 kg). Exam Physical Exam  BP 130s/90s-101 Gen - NAD CV RRR Abd - gravid, NT Ext -+ edema Prenatal labs: ABO, Rh: --/--/O POS (02/05 0155) Antibody: NEG (02/05 0155) Rubella:   RPR:    HBsAg:    HIV:    GBS:     Results for orders placed or performed during the hospital encounter of 10/14/16 (from the past 24 hour(s))  Urinalysis, Routine w reflex microscopic     Status: Abnormal   Collection Time: 10/14/16 12:35 AM  Result Value Ref Range   Color, Urine YELLOW YELLOW   APPearance CLOUDY (A) CLEAR   Specific Gravity, Urine 1.016 1.005 - 1.030   pH 5.0 5.0 - 8.0   Glucose, UA NEGATIVE NEGATIVE mg/dL   Hgb urine dipstick NEGATIVE NEGATIVE   Bilirubin Urine NEGATIVE NEGATIVE   Ketones, ur 5 (A) NEGATIVE mg/dL   Protein, ur NEGATIVE NEGATIVE mg/dL   Nitrite NEGATIVE NEGATIVE   Leukocytes, UA NEGATIVE NEGATIVE  Protein / creatinine ratio, urine     Status: None   Collection Time: 10/14/16 12:35 AM  Result Value Ref Range   Creatinine, Urine 119.00 mg/dL   Total Protein, Urine 7 mg/dL   Protein Creatinine Ratio 0.06  0.00 - 0.15 mg/mg[Cre]  Comprehensive metabolic panel     Status: Abnormal   Collection Time: 10/14/16 12:59 AM  Result Value Ref Range   Sodium 134 (L) 135 - 145 mmol/L   Potassium 4.0 3.5 - 5.1 mmol/L   Chloride 106 101 - 111 mmol/L   CO2 18 (L) 22 - 32 mmol/L   Glucose, Bld 111 (H) 65 - 99 mg/dL   BUN 11 6 - 20 mg/dL   Creatinine, Ser 1.61 0.44 - 1.00 mg/dL   Calcium 8.9 8.9 - 09.6 mg/dL   Total Protein 6.5 6.5 - 8.1 g/dL   Albumin 2.9 (L) 3.5 - 5.0 g/dL   AST 16 15 - 41 U/L   ALT 15 14 - 54 U/L   Alkaline Phosphatase 163 (H) 38 - 126 U/L   Total Bilirubin 0.5 0.3 - 1.2 mg/dL   GFR calc non Af Amer >60 >60 mL/min   GFR calc Af Amer >60 >60 mL/min   Anion gap 10 5 - 15  CBC     Status: Abnormal   Collection Time: 10/14/16 12:59 AM  Result Value Ref Range   WBC  9.1 4.0 - 10.5 K/uL   RBC 4.08 3.87 - 5.11 MIL/uL   Hemoglobin 11.8 (L) 12.0 - 15.0 g/dL   HCT 09.833.0 (L) 11.936.0 - 14.746.0 %   MCV 80.9 78.0 - 100.0 fL   MCH 28.9 26.0 - 34.0 pg   MCHC 35.8 30.0 - 36.0 g/dL   RDW 82.913.3 56.211.5 - 13.015.5 %   Platelets 275 150 - 400 K/uL  [facility]     Status: None   Collection Time: 10/14/16  1:55 AM  Result Value Ref Range   ABO/RH(D) O POS    Antibody Screen NEG    Sample Expiration 10/17/2016      Assessment/Plan:  36 weeks with gestational HTN and HA - r/o pre eclampsia with severe features. Admit for BP monitoring & repeat labs Treat HA   Jayveion Stalling 10/14/2016, 4:15 AM

## 2016-10-14 NOTE — MAU Provider Note (Signed)
History     CSN: [id number]  Arrival date and time: 10/14/16 0026   First Provider Initiated Contact with Patient 10/14/16 0112      Chief Complaint  Patient presents with  . Headache  . Visual changes   Patient is a 26 y/o G2P1 at 36 wks and 1 d who presents with three days of worsening headaches that did not improve with tylenol and 1 day of floaters in her vision. She was admitted to Antenatal at 34 wks and received betamethasone for elevated BP's. She has been followed in the office and not had any protenuria and pressures of 130s/80s. She reports no right upper quadrant pain and no shortness of breath. She reports regular fetal movement and no vaginal bleeding of discharged. She was checked 3 days ago and was closed.     OB History    Gravida Para Term Preterm AB Living   2       1 0   SAB TAB Ectopic Multiple Live Births     1            Past Medical History:  Diagnosis Date  . Anemia     Past Surgical History:  Procedure Laterality Date  . breast lump removed  08/2008   Rt breast    Family History  Problem Relation Age of Onset  . Hypertension Mother     Social History  Substance Use Topics  . Smoking status: Never Smoker  . Smokeless tobacco: Never Used  . Alcohol use No    Allergies: No Known Allergies  Prescriptions Prior to Admission  Medication Sig Dispense Refill Last Dose  . calcium carbonate (TUMS - DOSED IN MG ELEMENTAL CALCIUM) 500 MG chewable tablet Chew 1 tablet by mouth 3 (three) times daily as needed for indigestion or heartburn.   09/30/2016 at Unknown time  . Prenatal Vit-Fe Fumarate-FA (PRENATAL MULTIVITAMIN) TABS tablet Take 1 tablet by mouth at bedtime.   09/29/2016 at Unknown time    Review of Systems  Constitutional: Negative for chills and fever.  HENT: Negative for rhinorrhea and sore throat.   Eyes: Positive for visual disturbance.  Respiratory: Negative for cough, chest tightness and shortness of breath.   Cardiovascular:  Negative for chest pain, palpitations and leg swelling.  Gastrointestinal: Negative for abdominal distention, abdominal pain, constipation, diarrhea, nausea and vomiting.  Genitourinary: Negative for dysuria, flank pain and frequency.  Musculoskeletal: Negative for arthralgias, back pain and myalgias.  Neurological: Positive for headaches. Negative for dizziness and seizures.   Physical Exam   Blood pressure 139/96, pulse 109, temperature 98 F (36.7 C), temperature source Oral, resp. rate 18.  Physical Exam  Constitutional: She is oriented to person, place, and time. She appears well-developed and well-nourished.  HENT:  Head: Normocephalic and atraumatic.  Cardiovascular: Normal rate, regular rhythm and normal heart sounds.   Respiratory: Breath sounds normal. No respiratory distress. She has no wheezes.  Musculoskeletal: Normal range of motion. She exhibits edema.  Neurological: She is alert and oriented to person, place, and time. She displays normal reflexes. No cranial nerve deficit. Coordination normal.  Skin: Skin is warm and dry.  Psychiatric: She has a normal mood and affect. Her behavior is normal.    MAU Course  Procedures  MDM In MAU patient was placed on the monitor and and reactive NST at 145bpm. No contractions were noted.   Labs drawn. No proteinuria, plt and cmp normal.   D/W Dr. Renaldo Fiddler and will admit patient for  obs.   Assessment and Plan  #1: Gestational hypertension: elevated BP in MAU with headache and blurred vision. Will treat headache and monitor for improvement. Monitor BP q2 hrs overnight. Re-eval in morning.   Regina Higgins 10/14/2016, 1:38 AM

## 2016-10-15 ENCOUNTER — Encounter (HOSPITAL_COMMUNITY): Payer: Self-pay | Admitting: *Deleted

## 2016-10-15 LAB — COMPREHENSIVE METABOLIC PANEL
ALK PHOS: 156 U/L — AB (ref 38–126)
ALT: 14 U/L (ref 14–54)
AST: 18 U/L (ref 15–41)
Albumin: 2.9 g/dL — ABNORMAL LOW (ref 3.5–5.0)
Anion gap: 8 (ref 5–15)
BILIRUBIN TOTAL: 0.6 mg/dL (ref 0.3–1.2)
BUN: 5 mg/dL — ABNORMAL LOW (ref 6–20)
CALCIUM: 7.4 mg/dL — AB (ref 8.9–10.3)
CO2: 21 mmol/L — AB (ref 22–32)
CREATININE: 0.62 mg/dL (ref 0.44–1.00)
Chloride: 104 mmol/L (ref 101–111)
GFR calc non Af Amer: >60 mL/min (ref >60)
Glucose, Bld: 147 mg/dL — ABNORMAL HIGH (ref 65–99)
Potassium: 3.9 mmol/L (ref 3.5–5.1)
SODIUM: 133 mmol/L — AB (ref 135–145)
TOTAL PROTEIN: 6.7 g/dL (ref 6.5–8.1)

## 2016-10-15 LAB — CBC
HEMATOCRIT: 32.1 % — AB (ref 36.0–46.0)
HEMOGLOBIN: 11 g/dL — AB (ref 12.0–15.0)
MCH: 28.9 pg (ref 26.0–34.0)
MCHC: 34.3 g/dL (ref 30.0–36.0)
MCV: 84.5 fL (ref 78.0–100.0)
Platelets: 211 10*3/uL (ref 150–400)
RBC: 3.8 MIL/uL — AB (ref 3.87–5.11)
RDW: 13.5 % (ref 11.5–15.5)
WBC: 9.4 10*3/uL (ref 4.0–10.5)

## 2016-10-15 LAB — RPR: RPR: NONREACTIVE

## 2016-10-15 MED ORDER — EPHEDRINE 5 MG/ML INJ: 10.0000 mg | INTRAVENOUS | Status: DC | PRN

## 2016-10-15 MED ORDER — LACTATED RINGERS IV SOLN: 500.0000 mL | Freq: Once | INTRAVENOUS | Status: DC

## 2016-10-15 MED ORDER — PHENYLEPHRINE 40 MCG/ML (10ML) SYRINGE FOR IV PUSH (FOR BLOOD PRESSURE SUPPORT): 80.0000 ug | PREFILLED_SYRINGE | INTRAVENOUS | Status: DC | PRN

## 2016-10-15 MED ORDER — TERBUTALINE SULFATE 1 MG/ML IJ SOLN: 0.2500 mg | Freq: Once | INTRAMUSCULAR | Status: DC | PRN

## 2016-10-15 MED ORDER — FENTANYL 2.5 MCG/ML BUPIVACAINE 1/10 % EPIDURAL INFUSION (WH - ANES): 14.0000 mL/h | INTRAMUSCULAR | Status: DC | PRN

## 2016-10-15 MED ORDER — MISOPROSTOL 25 MCG QUARTER TABLET
25.0000 ug | ORAL_TABLET | ORAL | Status: DC
  Administered 2016-10-16: 25 ug via VAGINAL
  Filled 2016-10-15 (×5): qty 1
  Filled 2016-10-15 (×2): qty 0.25
  Filled 2016-10-15: qty 1
  Filled 2016-10-15: qty 0.25

## 2016-10-15 MED ORDER — DIPHENHYDRAMINE HCL 50 MG/ML IJ SOLN: 12.5000 mg | INTRAMUSCULAR | Status: DC | PRN

## 2016-10-15 MED ORDER — OXYTOCIN 40 UNITS IN LACTATED RINGERS INFUSION - SIMPLE MED
1.0000 m[IU]/min | INTRAVENOUS | Status: DC
  Administered 2016-10-15: 2 m[IU]/min via INTRAVENOUS

## 2016-10-15 NOTE — Progress Notes (Signed)
Blurry vision persists, no H/A BP has not required treatment  Vitals:   10/15/16 0700 10/15/16 0748  BP:  129/76  Pulse:  95  Resp: 18 16  Temp:     Good UO  Lungs CTA Abdomen no epigastric tenderness DTR 3+ Cx cl/very soft/more than 50% effaced/-3 Bedside U/S=vertex Cytotec placed  Magnesium sulfate running  A/P: Stable         D/W patient and husband two stage induction         Reevaluate at noon and will consider pitocin at that time         Labs

## 2016-10-15 NOTE — Progress Notes (Signed)
BP stable Magnesium sulfate running DTR 2+ Cx no change FHT cat one UCs irregular/mild on pitocin A/P: D/W patient         Stop pitocin         Repeat cytotec this pm

## 2016-10-15 NOTE — Progress Notes (Signed)
Cx 1/2 cm per nurse check FHT cat one UCs mild, irregular D/W patient will begin pitocin, risks reviewed BP stable

## 2016-10-16 ENCOUNTER — Inpatient Hospital Stay (HOSPITAL_COMMUNITY): Payer: 59 | Admitting: Anesthesiology

## 2016-10-16 ENCOUNTER — Encounter (HOSPITAL_COMMUNITY): Payer: Self-pay | Admitting: Anesthesiology

## 2016-10-16 ENCOUNTER — Encounter (HOSPITAL_COMMUNITY)
Admission: AD | Disposition: A | Discharge status: Home / Self Care | Payer: Self-pay | Source: Ambulatory Visit | Attending: Obstetrics and Gynecology

## 2016-10-16 LAB — CBC
HEMATOCRIT: 32.5 % — AB (ref 36.0–46.0)
Hemoglobin: 11 g/dL — ABNORMAL LOW (ref 12.0–15.0)
MCH: 28.3 pg (ref 26.0–34.0)
MCHC: 33.8 g/dL (ref 30.0–36.0)
MCV: 83.5 fL (ref 78.0–100.0)
Platelets: 250 10*3/uL (ref 150–400)
RBC: 3.89 MIL/uL (ref 3.87–5.11)
RDW: 13.7 % (ref 11.5–15.5)
WBC: 7.9 10*3/uL (ref 4.0–10.5)

## 2016-10-16 SURGERY — Surgical Case
Anesthesia: Spinal | Site: Abdomen | Wound class: Clean Contaminated

## 2016-10-16 MED ORDER — ONDANSETRON HCL 4 MG/2ML IJ SOLN: 4.0000 mg | Freq: Three times a day (TID) | INTRAMUSCULAR | Status: DC | PRN

## 2016-10-16 MED ORDER — DIPHENHYDRAMINE HCL 25 MG PO CAPS
25.0000 mg | ORAL_CAPSULE | ORAL | Status: DC | PRN
  Filled 2016-10-16: qty 1

## 2016-10-16 MED ORDER — NALBUPHINE HCL 10 MG/ML IJ SOLN: 5.0000 mg | INTRAMUSCULAR | Status: DC | PRN

## 2016-10-16 MED ORDER — MEPERIDINE HCL 25 MG/ML IJ SOLN: 6.2500 mg | INTRAMUSCULAR | Status: DC | PRN

## 2016-10-16 MED ORDER — DEXTROSE 5 % IV SOLN
INTRAVENOUS | Status: AC
  Filled 2016-10-16: qty 3000

## 2016-10-16 MED ORDER — HYDROMORPHONE HCL 1 MG/ML IJ SOLN: 0.2500 mg | INTRAMUSCULAR | Status: DC | PRN

## 2016-10-16 MED ORDER — TERBUTALINE SULFATE 1 MG/ML IJ SOLN: 0.2500 mg | Freq: Once | INTRAMUSCULAR | Status: DC | PRN

## 2016-10-16 MED ORDER — SIMETHICONE 80 MG PO CHEW: 80.0000 mg | CHEWABLE_TABLET | ORAL | Status: DC

## 2016-10-16 MED ORDER — PRENATAL MULTIVITAMIN CH
1.0000 | ORAL_TABLET | Freq: Every day | ORAL | Status: DC
  Administered 2016-10-16 – 2016-10-18 (×3): 1 via ORAL
  Filled 2016-10-16 (×3): qty 1

## 2016-10-16 MED ORDER — COCONUT OIL OIL
1.0000 "application " | TOPICAL_OIL | Status: DC | PRN
  Administered 2016-10-17: 1 via TOPICAL
  Filled 2016-10-16: qty 120

## 2016-10-16 MED ORDER — TETANUS-DIPHTH-ACELL PERTUSSIS 5-2.5-18.5 LF-MCG/0.5 IM SUSP: 0.5000 mL | Freq: Once | INTRAMUSCULAR | Status: DC

## 2016-10-16 MED ORDER — OXYTOCIN 40 UNITS IN LACTATED RINGERS INFUSION - SIMPLE MED: 1.0000 m[IU]/min | INTRAVENOUS | Status: DC

## 2016-10-16 MED ORDER — MAGNESIUM SULFATE 40 G IN LACTATED RINGERS - SIMPLE
INTRAVENOUS | Status: DC | PRN
  Administered 2016-10-16: 2 g/h via INTRAVENOUS

## 2016-10-16 MED ORDER — SCOPOLAMINE 1 MG/3DAYS TD PT72
MEDICATED_PATCH | TRANSDERMAL | Status: DC | PRN
  Administered 2016-10-16: 1 via TRANSDERMAL

## 2016-10-16 MED ORDER — KETOROLAC TROMETHAMINE 30 MG/ML IJ SOLN: 30.0000 mg | Freq: Once | INTRAMUSCULAR | Status: DC

## 2016-10-16 MED ORDER — BUPIVACAINE IN DEXTROSE 0.75-8.25 % IT SOLN
INTRATHECAL | Status: DC | PRN
  Administered 2016-10-16: 1.6 mL via INTRATHECAL

## 2016-10-16 MED ORDER — ZOLPIDEM TARTRATE 5 MG PO TABS: 5.0000 mg | ORAL_TABLET | Freq: Every evening | ORAL | Status: DC | PRN

## 2016-10-16 MED ORDER — MEASLES, MUMPS & RUBELLA VAC ~~LOC~~ INJ: 0.5000 mL | INJECTION | Freq: Once | SUBCUTANEOUS | Status: DC

## 2016-10-16 MED ORDER — FENTANYL CITRATE (PF) 100 MCG/2ML IJ SOLN
INTRAMUSCULAR | Status: DC | PRN
  Administered 2016-10-16: 20 ug via INTRATHECAL

## 2016-10-16 MED ORDER — SODIUM CHLORIDE 0.9% FLUSH
3.0000 mL | INTRAVENOUS | Status: DC | PRN
  Administered 2016-10-17: 3 mL via INTRAVENOUS
  Filled 2016-10-16: qty 3

## 2016-10-16 MED ORDER — SIMETHICONE 80 MG PO CHEW: 80.0000 mg | CHEWABLE_TABLET | ORAL | Status: DC | PRN

## 2016-10-16 MED ORDER — SCOPOLAMINE 1 MG/3DAYS TD PT72: 1.0000 | MEDICATED_PATCH | Freq: Once | TRANSDERMAL | Status: DC

## 2016-10-16 MED ORDER — METOCLOPRAMIDE HCL 5 MG/ML IJ SOLN
INTRAMUSCULAR | Status: AC
  Filled 2016-10-16: qty 2

## 2016-10-16 MED ORDER — DEXTROSE IN LACTATED RINGERS 5 % IV SOLN
INTRAVENOUS | Status: DC
  Administered 2016-10-16: 16:00:00 via INTRAVENOUS
  Administered 2016-10-17: 100 mL/h via INTRAVENOUS

## 2016-10-16 MED ORDER — OXYTOCIN 10 UNIT/ML IJ SOLN
INTRAMUSCULAR | Status: AC
  Filled 2016-10-16: qty 4

## 2016-10-16 MED ORDER — MORPHINE SULFATE (PF) 0.5 MG/ML IJ SOLN
INTRAMUSCULAR | Status: AC
  Filled 2016-10-16: qty 10

## 2016-10-16 MED ORDER — PHENYLEPHRINE 8 MG IN D5W 100 ML (0.08MG/ML) PREMIX OPTIME
INJECTION | INTRAVENOUS | Status: DC | PRN
  Administered 2016-10-16: 60 ug/min via INTRAVENOUS

## 2016-10-16 MED ORDER — CEFAZOLIN SODIUM-DEXTROSE 2-4 GM/100ML-% IV SOLN
INTRAVENOUS | Status: AC
Start: 2016-10-16 — End: 2016-10-16
  Filled 2016-10-16: qty 100

## 2016-10-16 MED ORDER — KETOROLAC TROMETHAMINE 30 MG/ML IJ SOLN: 30.0000 mg | Freq: Four times a day (QID) | INTRAMUSCULAR | Status: DC | PRN

## 2016-10-16 MED ORDER — PROMETHAZINE HCL 25 MG/ML IJ SOLN: 6.2500 mg | INTRAMUSCULAR | Status: DC | PRN

## 2016-10-16 MED ORDER — SIMETHICONE 80 MG PO CHEW
80.0000 mg | CHEWABLE_TABLET | Freq: Three times a day (TID) | ORAL | Status: DC
  Administered 2016-10-16 – 2016-10-19 (×8): 80 mg via ORAL
  Filled 2016-10-16 (×8): qty 1

## 2016-10-16 MED ORDER — NALBUPHINE HCL 10 MG/ML IJ SOLN: 5.0000 mg | Freq: Once | INTRAMUSCULAR | Status: DC | PRN

## 2016-10-16 MED ORDER — DIPHENHYDRAMINE HCL 50 MG/ML IJ SOLN: 12.5000 mg | INTRAMUSCULAR | Status: DC | PRN

## 2016-10-16 MED ORDER — MENTHOL 3 MG MT LOZG
1.0000 | LOZENGE | OROMUCOSAL | Status: DC | PRN
Start: 2016-10-16 — End: 2016-10-19

## 2016-10-16 MED ORDER — LACTATED RINGERS IV SOLN
INTRAVENOUS | Status: DC | PRN
Start: 2016-10-16 — End: 2016-10-16
  Administered 2016-10-16: 12:00:00 via INTRAVENOUS

## 2016-10-16 MED ORDER — SODIUM CHLORIDE 0.9% FLUSH: 3.0000 mL | INTRAVENOUS | Status: DC | PRN

## 2016-10-16 MED ORDER — DIPHENHYDRAMINE HCL 25 MG PO CAPS: 25.0000 mg | ORAL_CAPSULE | Freq: Four times a day (QID) | ORAL | Status: DC | PRN

## 2016-10-16 MED ORDER — BISACODYL 10 MG RE SUPP: 10.0000 mg | Freq: Every day | RECTAL | Status: DC | PRN

## 2016-10-16 MED ORDER — SODIUM CHLORIDE 0.9 % IR SOLN
Status: DC | PRN
  Administered 2016-10-16: 1000 mL

## 2016-10-16 MED ORDER — NALOXONE HCL 0.4 MG/ML IJ SOLN: 0.4000 mg | INTRAMUSCULAR | Status: DC | PRN

## 2016-10-16 MED ORDER — SODIUM CHLORIDE 0.9 % IV SOLN: 250.0000 mL | INTRAVENOUS | Status: DC

## 2016-10-16 MED ORDER — KETOROLAC TROMETHAMINE 30 MG/ML IJ SOLN
30.0000 mg | Freq: Four times a day (QID) | INTRAMUSCULAR | Status: DC | PRN
  Administered 2016-10-16: 30 mg via INTRAMUSCULAR

## 2016-10-16 MED ORDER — PHENYLEPHRINE HCL 10 MG/ML IJ SOLN
INTRAMUSCULAR | Status: DC | PRN
  Administered 2016-10-16 (×3): 80 ug via INTRAVENOUS

## 2016-10-16 MED ORDER — OXYCODONE-ACETAMINOPHEN 5-325 MG PO TABS
1.0000 | ORAL_TABLET | ORAL | Status: DC | PRN
  Administered 2016-10-17 – 2016-10-19 (×7): 1 via ORAL
  Filled 2016-10-16 (×7): qty 1

## 2016-10-16 MED ORDER — FENTANYL CITRATE (PF) 100 MCG/2ML IJ SOLN
INTRAMUSCULAR | Status: AC
  Filled 2016-10-16: qty 2

## 2016-10-16 MED ORDER — ONDANSETRON HCL 4 MG/2ML IJ SOLN
INTRAMUSCULAR | Status: AC
  Filled 2016-10-16: qty 2

## 2016-10-16 MED ORDER — CEFAZOLIN SODIUM-DEXTROSE 2-3 GM-% IV SOLR
INTRAVENOUS | Status: DC | PRN
  Administered 2016-10-16: 2 g via INTRAVENOUS

## 2016-10-16 MED ORDER — OXYTOCIN 40 UNITS IN LACTATED RINGERS INFUSION - SIMPLE MED: 2.5000 [IU]/h | INTRAVENOUS | Status: AC

## 2016-10-16 MED ORDER — ONDANSETRON HCL 4 MG/2ML IJ SOLN
INTRAMUSCULAR | Status: DC | PRN
  Administered 2016-10-16: 4 mg via INTRAVENOUS

## 2016-10-16 MED ORDER — DIBUCAINE 1 % RE OINT: 1.0000 "application " | TOPICAL_OINTMENT | RECTAL | Status: DC | PRN

## 2016-10-16 MED ORDER — ACETAMINOPHEN 325 MG PO TABS
650.0000 mg | ORAL_TABLET | ORAL | Status: DC | PRN
Start: 2016-10-16 — End: 2016-10-16

## 2016-10-16 MED ORDER — NALOXONE HCL 2 MG/2ML IJ SOSY
1.0000 ug/kg/h | PREFILLED_SYRINGE | INTRAVENOUS | Status: DC | PRN
  Filled 2016-10-16: qty 2

## 2016-10-16 MED ORDER — OXYCODONE-ACETAMINOPHEN 5-325 MG PO TABS: 2.0000 | ORAL_TABLET | ORAL | Status: DC | PRN

## 2016-10-16 MED ORDER — METOCLOPRAMIDE HCL 5 MG/ML IJ SOLN
INTRAMUSCULAR | Status: DC | PRN
  Administered 2016-10-16: 10 mg via INTRAVENOUS

## 2016-10-16 MED ORDER — SODIUM CHLORIDE 0.9% FLUSH: 3.0000 mL | Freq: Two times a day (BID) | INTRAVENOUS | Status: DC

## 2016-10-16 MED ORDER — FLEET ENEMA 7-19 GM/118ML RE ENEM: 1.0000 | ENEMA | Freq: Every day | RECTAL | Status: DC | PRN

## 2016-10-16 MED ORDER — IBUPROFEN 600 MG PO TABS: 600.0000 mg | ORAL_TABLET | Freq: Four times a day (QID) | ORAL | Status: DC | PRN

## 2016-10-16 MED ORDER — MORPHINE SULFATE (PF) 0.5 MG/ML IJ SOLN
INTRAMUSCULAR | Status: DC | PRN
  Administered 2016-10-16: .2 mg via INTRATHECAL

## 2016-10-16 MED ORDER — IBUPROFEN 800 MG PO TABS
800.0000 mg | ORAL_TABLET | Freq: Three times a day (TID) | ORAL | Status: DC | PRN
  Administered 2016-10-17 – 2016-10-18 (×3): 800 mg via ORAL
  Filled 2016-10-16 (×3): qty 1

## 2016-10-16 MED ORDER — SENNOSIDES-DOCUSATE SODIUM 8.6-50 MG PO TABS
2.0000 | ORAL_TABLET | ORAL | Status: DC
  Administered 2016-10-16 – 2016-10-19 (×3): 2 via ORAL
  Filled 2016-10-16 (×3): qty 2

## 2016-10-16 MED ORDER — PHENYLEPHRINE 8 MG IN D5W 100 ML (0.08MG/ML) PREMIX OPTIME
INJECTION | INTRAVENOUS | Status: AC
  Filled 2016-10-16: qty 100

## 2016-10-16 MED ORDER — OXYTOCIN 40 UNITS IN LACTATED RINGERS INFUSION - SIMPLE MED
INTRAVENOUS | Status: DC | PRN
  Administered 2016-10-16: 40 [IU] via INTRAVENOUS

## 2016-10-16 MED ORDER — PHENYLEPHRINE 40 MCG/ML (10ML) SYRINGE FOR IV PUSH (FOR BLOOD PRESSURE SUPPORT)
PREFILLED_SYRINGE | INTRAVENOUS | Status: AC
  Filled 2016-10-16: qty 10

## 2016-10-16 MED ORDER — LACTATED RINGERS IV SOLN
INTRAVENOUS | Status: DC | PRN
  Administered 2016-10-16: 11:00:00 via INTRAVENOUS

## 2016-10-16 MED ORDER — WITCH HAZEL-GLYCERIN EX PADS: 1.0000 "application " | MEDICATED_PAD | CUTANEOUS | Status: DC | PRN

## 2016-10-16 MED ORDER — KETOROLAC TROMETHAMINE 30 MG/ML IJ SOLN
INTRAMUSCULAR | Status: AC
  Filled 2016-10-16: qty 1

## 2016-10-16 MED ORDER — ACETAMINOPHEN 500 MG PO TABS
1000.0000 mg | ORAL_TABLET | Freq: Four times a day (QID) | ORAL | Status: AC
Start: 2016-10-16 — End: 2016-10-17
  Administered 2016-10-16 – 2016-10-17 (×3): 1000 mg via ORAL
  Filled 2016-10-16 (×3): qty 2

## 2016-10-16 SURGICAL SUPPLY — 25 items
CHLORAPREP W/TINT 26ML (MISCELLANEOUS) ×2 IMPLANT
CLAMP CORD UMBIL (MISCELLANEOUS) IMPLANT
CLOTH BEACON ORANGE TIMEOUT ST (SAFETY) ×2 IMPLANT
DRSG OPSITE POSTOP 4X10 (GAUZE/BANDAGES/DRESSINGS) ×2 IMPLANT
ELECT REM PT RETURN 9FT ADLT (ELECTROSURGICAL) ×2
ELECTRODE REM PT RTRN 9FT ADLT (ELECTROSURGICAL) ×1 IMPLANT
EXTRACTOR VACUUM M CUP 4 TUBE (SUCTIONS) IMPLANT
GLOVE BIO SURGEON STRL SZ7 (GLOVE) ×2 IMPLANT
GLOVE BIOGEL PI IND STRL 7.0 (GLOVE) ×2 IMPLANT
GLOVE BIOGEL PI INDICATOR 7.0 (GLOVE) ×2
GOWN STRL REUS W/TWL LRG LVL3 (GOWN DISPOSABLE) ×4 IMPLANT
KIT ABG SYR 3ML LUER SLIP (SYRINGE) IMPLANT
NEEDLE HYPO 25X5/8 SAFETYGLIDE (NEEDLE) ×2 IMPLANT
NS IRRIG 1000ML POUR BTL (IV SOLUTION) ×2 IMPLANT
PACK C SECTION WH (CUSTOM PROCEDURE TRAY) ×2 IMPLANT
PAD OB MATERNITY 4.3X12.25 (PERSONAL CARE ITEMS) ×2 IMPLANT
PENCIL SMOKE EVAC W/HOLSTER (ELECTROSURGICAL) ×2 IMPLANT
STRIP CLOSURE SKIN 1/2X4 (GAUZE/BANDAGES/DRESSINGS) ×2 IMPLANT
SUT CHROMIC 0 CTX 36 (SUTURE) ×6 IMPLANT
SUT MON AB 4-0 PS1 27 (SUTURE) ×2 IMPLANT
SUT PDS AB 0 CT1 27 (SUTURE) ×4 IMPLANT
SUT VIC AB 3-0 CT1 27 (SUTURE) ×2
SUT VIC AB 3-0 CT1 TAPERPNT 27 (SUTURE) ×2 IMPLANT
TOWEL OR 17X24 6PK STRL BLUE (TOWEL DISPOSABLE) ×2 IMPLANT
TRAY FOLEY CATH SILVER 14FR (SET/KITS/TRAYS/PACK) ×2 IMPLANT

## 2016-10-16 NOTE — Anesthesia Procedure Notes (Signed)
Spinal  Patient location during procedure: OR Start time: 10/16/2016 11:25 AM End time: 10/16/2016 11:29 AM Staffing Anesthesiologist: Leilani AbleHATCHETT, Jayvyn Haselton Performed: anesthesiologist  Preanesthetic Checklist Completed: patient identified, surgical consent, pre-op evaluation, timeout performed, IV checked, risks and benefits discussed and monitors and equipment checked Spinal Block Patient position: sitting Prep: site prepped and draped and DuraPrep Patient monitoring: heart rate, cardiac monitor, continuous pulse ox and blood pressure Approach: midline Location: L3-4 Injection technique: single-shot Needle Needle type: Sprotte  Needle gauge: 24 G Needle length: 9 cm Needle insertion depth: 7 cm Assessment Sensory level: T4

## 2016-10-16 NOTE — Op Note (Signed)
Preoperative diagnosis: 36+ week IUP, preeclampsia with severe features, failed induction  Postoperative diagnosis: Same  Procedure: Primary low transverse cesarean section  Surgeon: Marcelle OverlieHolland  Anesthesia: Spinal  EBL: 700 cc  Procedure and findings:  Patient taken the operating room after an adequate level of spinal anesthesia was obtained with the patient in left tilt position, the abdomen prepped and draped in usual fashion and a Foley catheter was position. Appropriate timeouts taken at that point. Transverse incision made 2 finger breaths above the symphysis carried down to the fascia which was incised and extended transversely. Rectus muscle divided in the midline, peritoneum entered superiorly without incident and extended in a vertical fashion. The vesicouterine serosa was incised and bluntly dissected below, bladder blade was repositioned, transverse incision made lower segment extended with bandage scissors clear fluid noted the patient then delivered of a healthy infant that was vigorous, VE assisted for easy vertex delivery. The placenta was delivered manually intact, uterus exteriorized, cavity wiped clean with a laparotomy pack noted be clean. Closure obtained the first layer of 0 chromic in a locked fashion followed by an imbricating layer of 0 chromic. This was hemostatic bilateral tubes and ovaries were normal. Prior to closure sponge, needle, history counts reported as correct 2 peritoneum closed with a 3-0 Vicryl running suture the same to reapproximate the rectus muscles in the midline. 0 PDS was then used from laterally to midline on either side to close the fascia subcutaneous tissue was irrigated noted be hemostatic was reapproximated with 3-0 Vicryl interrupted sutures. 4-0 Monocryl subcuticular skin closure with honeycomb dressing. She will be maintained on magnesium sulfate 2 g per hour postoperatively, clear urine noted at the end the case.  Dictated with dragon  medical  Bryonna Sundby Milana ObeyM Shronda Boeh M.D.

## 2016-10-16 NOTE — Lactation Note (Signed)
This note was copied from a baby's chart. Lactation Consultation Note  Patient Name: Regina Higgins AVWUJ'WToday's Date: 10/16/2016 Reason for consult: Initial assessment;Late preterm infant;Breast surgery (weight pending, Fibroadenoma removal left breast 2009)   Initial assessment with first time mom of LPT infant in PACU. Mom reports she has been here since Monday on MgSO4. Mom with scar to top of right breast and reports surgical removal of Fibroadenoma in 2009. Mom reports + breast changes with pregnancy.   Infant latched easily to left breast and fed for about 10 minutes, intermittent swallows were noted. Infant was then latched to right breast and fed for another 5 minutes. He as then spoon fed 1 cc Colostrum. Mom with compressible breasts and right areola compressible, left areola semi compressible with everted nipples.   LPT infant handout given and explained to parents. Enc parents to feed at least every 3 hours, awakening as needed for feeding. Enc parents to decrease stim to infant between feeds, leave his hat on at all times and to not feed longer than 30 minutes at a feeding.   Discussed LPT infant feedings and what to expect over the next few days. Let parents know that infant will be reassessed and weighed at about 8 hours of age and will discuss at that time if infant needs supplement. Enc mom to begin pumping this afternoon to stimulate milk to come in and to hand express after each feeding and spoon feed infant . Parents were shown how to hand express and spoon feed infant, infant tolerated spoon feeding well. Feeding log given with instructions for use.   BF Resources handout and LC Brochure given, mom informed of IP/OP Services, BF Support Groups and LC phone #. Enc mom to call out for feeding assistance as needed.    Maternal Data Formula Feeding for Exclusion: No Has patient been taught Hand Expression?: Yes Does the patient have breastfeeding experience prior to this  delivery?: No  Feeding Feeding Type: Breast Fed Length of feed: 15 min  LATCH Score/Interventions Latch: Grasps breast easily, tongue down, lips flanged, rhythmical sucking.  Audible Swallowing: A few with stimulation Intervention(s): Alternate breast massage;Hand expression;Skin to skin  Type of Nipple: Everted at rest and after stimulation  Comfort (Breast/Nipple): Soft / non-tender     Hold (Positioning): Assistance needed to correctly position infant at breast and maintain latch. Intervention(s): Breastfeeding basics reviewed;Support Pillows;Skin to skin;Position options  LATCH Score: 8  Lactation Tools Discussed/Used WIC Program: No   Consult Status Consult Status: Follow-up Date: 10/16/16 Follow-up type: In-patient    Silas FloodSharon S Hice 10/16/2016, 1:32 PM

## 2016-10-16 NOTE — Transfer of Care (Signed)
Immediate Anesthesia Transfer of Care Note  Patient: Regina ParkinsonCaitlin Covino  Procedure(s) Performed: Procedure(s): CESAREAN SECTION (N/A)  Patient Location: PACU  Anesthesia Type:Spinal  Level of Consciousness: awake, alert  and oriented  Airway & Oxygen Therapy: Patient Spontanous Breathing  Post-op Assessment: Report given to RN and Post -op Vital signs reviewed and stable  Post vital signs: Reviewed and stable  Last Vitals:  Vitals:   10/16/16 1250 10/16/16 1251  BP:    Pulse: 86 86  Resp: (!) 21 17  Temp:      Last Pain:  Vitals:   10/16/16 0752  TempSrc: Oral  PainSc:          Complications: No apparent anesthesia complications

## 2016-10-16 NOTE — Anesthesia Postprocedure Evaluation (Signed)
Anesthesia Post Note  Patient: Regina Higgins  Procedure(s) Performed: Procedure(s) (LRB): CESAREAN SECTION (N/A)  Patient location during evaluation: PACU Anesthesia Type: Spinal Level of consciousness: awake Pain management: pain level controlled Vital Signs Assessment: post-procedure vital signs reviewed and stable Respiratory status: spontaneous breathing Cardiovascular status: stable Postop Assessment: no headache, no backache, spinal receding, patient able to bend at knees and no signs of nausea or vomiting Anesthetic complications: no        Last Vitals:  Vitals:   10/16/16 1303 10/16/16 1304  BP:    Pulse: 86 85  Resp: 19 20  Temp:      Last Pain:  Vitals:   10/16/16 1246  TempSrc:   PainSc: 0-No pain   Pain Goal:                 Emily Forse JR,JOHN Kaleigha Chamberlin

## 2016-10-16 NOTE — Anesthesia Postprocedure Evaluation (Addendum)
Anesthesia Post Note  Patient: Regina Higgins  Procedure(s) Performed: Procedure(s) (LRB): CESAREAN SECTION (N/A)  Patient location during evaluation: [facility] Anesthesia Type: Spinal Level of consciousness: awake Pain management: pain level controlled Vital Signs Assessment: post-procedure vital signs reviewed and stable Respiratory status: spontaneous breathing Cardiovascular status: stable Postop Assessment: no headache, no backache, spinal receding, patient able to bend at knees, no signs of nausea or vomiting and adequate PO intake Anesthetic complications: no        Last Vitals:  Vitals:   10/16/16 1304 10/16/16 1446  BP:  133/88  Pulse: 85 (!) 101  Resp: 20 18  Temp:  37 C    Last Pain:  Vitals:   10/16/16 1446  TempSrc: Axillary  PainSc:    Pain Goal:                 MULLINS,JANET

## 2016-10-16 NOTE — Progress Notes (Signed)
Pump at bedside. Patient encouraged to pump every 3 hours and supplement that breastmilk to baby. Also discussed alimentum formula if needed. Patient not wanting to supplement with formula at this time but is willing to discuss again once weights obtained.

## 2016-10-16 NOTE — Anesthesia Preprocedure Evaluation (Signed)
Anesthesia Evaluation  Patient identified by MRN, date of birth, ID band Patient awake    Reviewed: Allergy & Precautions, H&P , NPO status , Patient's Chart, lab work & pertinent test results  Airway Mallampati: II  TM Distance: >3 FB Neck ROM: full    Dental no notable dental hx.    Pulmonary neg pulmonary ROS,    Pulmonary exam normal        Cardiovascular Normal cardiovascular exam     Neuro/Psych negative neurological ROS  negative psych ROS   GI/Hepatic negative GI ROS, Neg liver ROS,   Endo/Other  Morbid obesity  Renal/GU negative Renal ROS     Musculoskeletal   Abdominal (+) + obese,   Peds  Hematology negative hematology ROS (+)   Anesthesia Other Findings   Reproductive/Obstetrics (+) Pregnancy                             Anesthesia Physical Anesthesia Plan  ASA: III  Anesthesia Plan: Spinal   Post-op Pain Management:    Induction:   Airway Management Planned:   Additional Equipment:   Intra-op Plan:   Post-operative Plan:   Informed Consent: I have reviewed the patients History and Physical, chart, labs and discussed the procedure including the risks, benefits and alternatives for the proposed anesthesia with the patient or authorized representative who has indicated his/her understanding and acceptance.     Plan Discussed with: CRNA and Surgeon  Anesthesia Plan Comments:         Anesthesia Quick Evaluation

## 2016-10-16 NOTE — Progress Notes (Signed)
Received Cytotec over last evening, this am cx closed/ firm/unengaged presenting part>>>discussed day #2 pitocin vs CS for failed induction>>>risks reviewed>>>pt + family request primary CS

## 2016-10-16 NOTE — Addendum Note (Signed)
Addendum  created 10/16/16 1509 by Renford DillsJanet L Tamela Elsayed, CRNA   Sign clinical note

## 2016-10-17 LAB — CBC
HEMATOCRIT: 27.6 % — AB (ref 36.0–46.0)
Hemoglobin: 9.6 g/dL — ABNORMAL LOW (ref 12.0–15.0)
MCH: 28.9 pg (ref 26.0–34.0)
MCHC: 34.8 g/dL (ref 30.0–36.0)
MCV: 83.1 fL (ref 78.0–100.0)
Platelets: 219 10*3/uL (ref 150–400)
RBC: 3.32 MIL/uL — AB (ref 3.87–5.11)
RDW: 13.8 % (ref 11.5–15.5)
WBC: 7.8 10*3/uL (ref 4.0–10.5)

## 2016-10-17 LAB — BIRTH TISSUE RECOVERY COLLECTION (PLACENTA DONATION)

## 2016-10-17 NOTE — Lactation Note (Signed)
This note was copied from a baby's chart. Lactation Consultation Note  Patient Name: Regina Elinor ParkinsonCaitlin Mcmann BJYNW'GToday's Date: 10/17/2016 Reason for consult: Follow-up assessment;Infant < 6lbs;Late preterm infant   Follow up with at mom and dad's request as they could not get infant latched. Mom had infant at breast with 5 fr feeding tube, infant not latching. Readjusted feeding tube and aligned infant and he fed for 15 minutes at the breast taking 3 cc colostrum. Infant was not vigorous at the breast and needed lots of stim to keep suckling. Infant was then removed and dad was taught to finger feed him, infant sleepy and took an additional 3 cc formula. Feeding was then stopped and mom pumping. Enc parents to keep up the good work. Follow up tomorrow.    Maternal Data Has patient been taught Hand Expression?: Yes Does the patient have breastfeeding experience prior to this delivery?: No  Feeding Feeding Type: Formula Length of feed: 10 min  LATCH Score/Interventions Latch: Repeated attempts needed to sustain latch, nipple held in mouth throughout feeding, stimulation needed to elicit sucking reflex. Intervention(s): Adjust position;Assist with latch;Breast massage;Breast compression  Audible Swallowing: Spontaneous and intermittent (with 5 fr feeding tube at breast) Intervention(s): Alternate breast massage;Hand expression;Skin to skin  Type of Nipple: Everted at rest and after stimulation  Comfort (Breast/Nipple): Soft / non-tender     Hold (Positioning): Assistance needed to correctly position infant at breast and maintain latch. Intervention(s): Breastfeeding basics reviewed;Support Pillows;Position options;Skin to skin  LATCH Score: 8  Lactation Tools Discussed/Used Tools: 25F feeding tube / Syringe   Consult Status Consult Status: Follow-up Date: 10/18/16 Follow-up type: In-patient    Silas FloodSharon S Shatasha Lambing 10/17/2016, 5:39 PM

## 2016-10-17 NOTE — Lactation Note (Signed)
This note was copied from a baby's chart. Lactation Consultation Note  Patient Name: Regina Elinor ParkinsonCaitlin Egerton ZOXWR'UToday's Date: 10/17/2016 Reason for consult: Follow-up assessment;Infant < 6lbs;Late preterm infant   Follow up with mom of 2626 hour old infant. Infant awakened for feeding, he awakened easily. He latched to breast for a few minutes with no swallows noted. Attempted to hand express and obtained a few gtts. Decision made with mom to begin formula supplementation. Mom agreeable to using 5 fr feeding tube at breast. Applied 5 fr feeding tube to right breast primed with formula. Infant fed more vigorously intermittently and took 7 cc Alimentum over 30 minutes and tolerated it well. Infant swaddled and placed in crib, drifting off to sleep. Enc parents to call with questions/concerns.    Maternal Data Formula Feeding for Exclusion: No Has patient been taught Hand Expression?: Yes Does the patient have breastfeeding experience prior to this delivery?: No  Feeding Feeding Type: Breast Fed Length of feed: 30 min  LATCH Score/Interventions Latch: Repeated attempts needed to sustain latch, nipple held in mouth throughout feeding, stimulation needed to elicit sucking reflex. Intervention(s): Adjust position;Assist with latch;Breast massage;Breast compression  Audible Swallowing: Spontaneous and intermittent (No swallows at breast only) Intervention(s): Skin to skin;Hand expression;Alternate breast massage  Type of Nipple: Everted at rest and after stimulation  Comfort (Breast/Nipple): Soft / non-tender     Hold (Positioning): Assistance needed to correctly position infant at breast and maintain latch. Intervention(s): Breastfeeding basics reviewed;Support Pillows;Position options;Skin to skin  LATCH Score: 8  Lactation Tools Discussed/Used Tools: 50F feeding tube / Syringe WIC Program: No Pump Review: Setup, frequency, and cleaning;Milk Storage Initiated by:: Reviewed   Consult  Status Consult Status: Follow-up Date: 10/18/16 Follow-up type: In-patient    Silas FloodSharon S Jovonte Commins 10/17/2016, 2:24 PM

## 2016-10-17 NOTE — Progress Notes (Addendum)
S: No complaints. Feeling well. Lochia appropriate. No subjective fevers/chills. Denies SOB, Cp, worsening swelling, RUQ pain, epigastric pain, HA and blurry vision.   O:  Vitals:   10/17/16 0400 10/17/16 0800  BP: 135/85 117/71  Pulse: (!) 105 (!) 105  Resp: 18 18  Temp: 97.8 F (36.6 C) 98 F (36.7 C)    Gen: NAD, A&O Pulm: NWOB Abd: soft, appropriately ttp, fundus firm and below Umb/ Incision c/d/i.  Ext: No evidence of DVT, trace edema b/l  Labs CBC    Component Value Date/Time   WBC 7.8 10/17/2016 0553   RBC 3.32 (L) 10/17/2016 0553   HGB 9.6 (L) 10/17/2016 0553   HCT 27.6 (L) 10/17/2016 0553   PLT 219 10/17/2016 0553   MCV 83.1 10/17/2016 0553   MCH 28.9 10/17/2016 0553   MCHC 34.8 10/17/2016 0553   RDW 13.8 10/17/2016 0553   LYMPHSABS 1.9 09/30/2016 1315   MONOABS 0.4 09/30/2016 1315   EOSABS 0.2 09/30/2016 1315   BASOSABS 0.0 09/30/2016 1315    A/P:  POD1 s/p pLTCS, doing well pp. AFVSS. Benign exam.   # Pre-eclampsia with severe features: Magnesium per protocol x 24 hours pp PIH labs stable, repeat prn IV anti-hypertensives prn persistent severe range Bps CTM Bps  # Postpartum: no issues  # Dispo: Continue present care. Plan for d/c POD#2 or 3. Circ today.   Belva AgeeElise Colie Fugitt MD

## 2016-10-17 NOTE — Lactation Note (Signed)
This note was copied from a baby's chart. Lactation Consultation Note  Patient Name: Regina Higgins ZOXWR'UToday's Date: 10/17/2016 Reason for consult: Follow-up assessment;Infant < 6lbs;Late preterm infant   Follow up with mom of 3323 hour old infant. Infant with 6 BF and 2 attempts for 10-25 minutes, 7 voids and 2 stools since birth, parents report one of the stools was very large. LATCH scores 8-9. Infant weight 5 lb 5.8 oz with weight loss of 4.2 % in 12 hours of life, infant with large output.   Mom reports infant is feeding good, he is sleepy with feedings, parents are using awakening techniques with feedings. Infant just returned from circumcision and was awake. Assisted mom in latching infant to left breast in the cross cradle hold. Infant latched easily and suckled for about 5 minutes, he was noted to have frequent swallows with feeding. He fell asleep after. Hand expressed 1 cc colostrum and colostrum was spoon fed to infant. Infant tolerated it well and fell asleep.   Mom pumped x 2 last night and did not get any colostrum. Enc mom to pump every 2-3 hour post feeding followed by hand expression. Discussed with mom that I will return for 2 pm feeding and will reassess feeding to determine if supplementation is needed. Parents are agreeable to supplementation if infant needs it.   Parents report they have spoon fed infant some colostrum throughout the night, praised them for their efforts in feeding the infant. We discussed that circumcision may make him sleepy today.    Maternal Data Formula Feeding for Exclusion: No Has patient been taught Hand Expression?: Yes Does the patient have breastfeeding experience prior to this delivery?: No  Feeding Feeding Type: Breast Fed Length of feed: 3 min  LATCH Score/Interventions Latch: Repeated attempts needed to sustain latch, nipple held in mouth throughout feeding, stimulation needed to elicit sucking reflex. Intervention(s): Adjust  position;Assist with latch;Breast massage;Breast compression  Audible Swallowing: Spontaneous and intermittent Intervention(s): Alternate breast massage;Hand expression;Skin to skin  Type of Nipple: Everted at rest and after stimulation  Comfort (Breast/Nipple): Soft / non-tender     Hold (Positioning): Assistance needed to correctly position infant at breast and maintain latch. Intervention(s): Breastfeeding basics reviewed;Support Pillows;Position options;Skin to skin  LATCH Score: 8  Lactation Tools Discussed/Used WIC Program: No Pump Review: Setup, frequency, and cleaning;Milk Storage Initiated by:: Reviewed   Consult Status Consult Status: Follow-up Date: 10/17/16 Follow-up type: In-patient    Silas FloodSharon S Avrie Kedzierski 10/17/2016, 11:27 AM

## 2016-10-18 NOTE — Lactation Note (Signed)
This note was copied from a baby's chart. Lactation Consultation Note  Patient Name: Boy Elinor ParkinsonCaitlin Ringer KGMWN'UToday's Date: 10/18/2016   Follow up visit made.  Mom states she is pumping every 3 hours and supplementing with formula per bottle.  Baby is taking 30 mls.  She obtained 10 mls this AM and is pleased volume has increased.  Discussed late preterm feeding norm and reassured that baby should become more effective at breast as he reaches term.  Reviewed amount needed per day of life.  Mom has a Spectra pump for home use.  Answered questions and encouraged to call for concerns/assist.  Maternal Data    Feeding    LATCH Score/Interventions                      Lactation Tools Discussed/Used     Consult Status      Huston FoleyMOULDEN, Storey Stangeland S 10/18/2016, 10:52 AM

## 2016-10-18 NOTE — Progress Notes (Signed)
Subjective:  Postpartum Day 2: Cesarean Delivery Patient reports tolerating PO, + flatus and no problems voiding.   Denies HA, or floaters. No RUQ pain Objective: Vital signs in last 24 hours: Temp:  [97.6 F (36.4 C)-98.9 F (37.2 C)] 98.6 F (37 C) (02/09 0350) Pulse Rate:  [108-111] 108 (02/09 0350) Resp:  [16-18] 16 (02/09 0350) BP: (129-143)/(55-85) 143/69 (02/09 0350) SpO2:  [98 %-100 %] 99 % (02/09 0350)  Physical Exam:  General: alert and cooperative Lochia: appropriate Uterine Fundus: firm Incision: healing well DVT Evaluation: No evidence of DVT seen on physical exam. Negative Homan's sign. No cords or calf tenderness. Calf/Ankle edema is present. DTR's 2+, no clonus   Recent Labs  10/16/16 0646 10/17/16 0553  HGB 11.0* 9.6*  HCT 32.5* 27.6*    Assessment/Plan: Status post Cesarean section. [facility]  Continue current care  May shower.  CURTIS,CAROL G 10/18/2016, 8:22 AM

## 2016-10-19 MED ORDER — OXYCODONE-ACETAMINOPHEN 5-325 MG PO TABS: 1.0000 | ORAL_TABLET | ORAL | 0 refills | Status: DC | PRN

## 2016-10-19 MED ORDER — IBUPROFEN 800 MG PO TABS: 800.0000 mg | ORAL_TABLET | Freq: Three times a day (TID) | ORAL | 0 refills | Status: DC | PRN

## 2016-10-19 NOTE — Lactation Note (Signed)
This note was copied from a baby's chart. Lactation Consultation Note  Patient Name: Regina Higgins GNFAO'ZToday's Date: 10/19/2016 Reason for consult: Follow-up assessment    With this mom of a LPI, now 2369 hours old, and 36 6/7 weeks CGA, and weight now under 6 lbs. Mom is pumping and bottle feeding for now, but wants to transition to breast feeding, when stark is bigger and older. Mom's milk is transitioning in, pumping up to 30 ml's every 3 hours. I advised mom to keep pumping every 3 hours, or more if full, and feeding the baby at least every 3 hours. Mom is still supplementing  With formula, if baby still hungry, and not limiting his intake. Mom will call lactation for questions concerns, or o/p consult.  Breast care reviewed. Mom and baby doing well, dad very involved, and helping with bottle feedings.    Maternal Data    Feeding    LATCH Score/Interventions                      Lactation Tools Discussed/Used     Consult Status Consult Status: Complete Follow-up type: Call as needed    [name], Regina Higgins 10/19/2016, 9:18 AM

## 2016-10-19 NOTE — Discharge Summary (Signed)
Obstetric Discharge Summary Reason for Admission: induction of labor Prenatal Procedures: Preeclampsia Intrapartum Procedures: cesarean: low cervical, transverse Postpartum Procedures: none Complications-Operative and Postpartum: none Hemoglobin  Date Value Ref Range Status  10/17/2016 9.6 (L) 12.0 - 15.0 g/dL Final   HCT  Date Value Ref Range Status  10/17/2016 27.6 (L) 36.0 - 46.0 % Final    Physical Exam:  General: alert, cooperative, appears stated age and no distress Lochia: appropriate Uterine Fundus: firm Incision: healing well DVT Evaluation: No evidence of DVT seen on physical exam.  Discharge Diagnoses: Term Pregnancy-delivered and Preelampsia  Discharge Information: Date: 10/19/2016 Activity: pelvic rest Diet: routine Medications: Ibuprofen and Percocet Condition: stable Instructions: refer to practice specific booklet Discharge to: home   Newborn Data: Live born female  Birth Weight: 6 lb 4 oz (2835 g) APGAR: 9, 9  Home with mother.  Regina Higgins C 10/19/2016, 10:24 AM

## 2017-02-14 NOTE — Addendum Note (Signed)
Addendum  created 02/14/17 0953 by Pansy Ostrovsky, MD   Sign clinical note    

## 2017-02-14 NOTE — Addendum Note (Signed)
Addendum  created 02/14/17 0954 by Jakoby Melendrez, MD   Sign clinical note    

## 2017-08-14 IMAGING — US US MFM OB COMP +14 WKS
1 series · 14 of 28 positions shown · non-contrast
Comparison: none

[Series 1: us mfm ob comp +14 wks · 56 acquisitions, 14 frames shown]
[im 3/56]
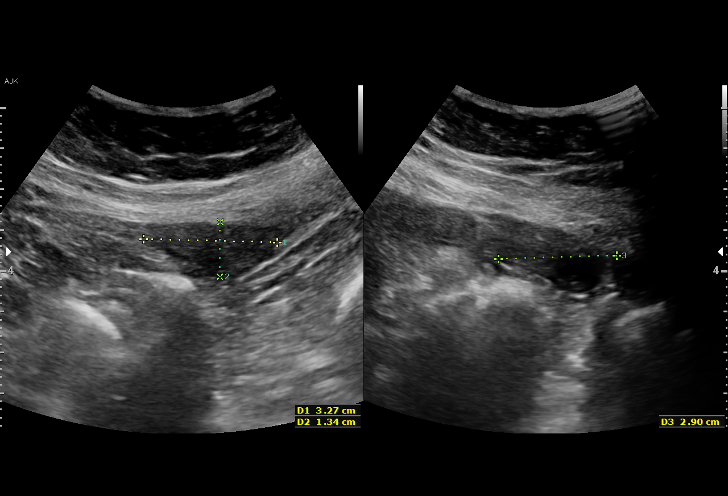
[im 7/56]
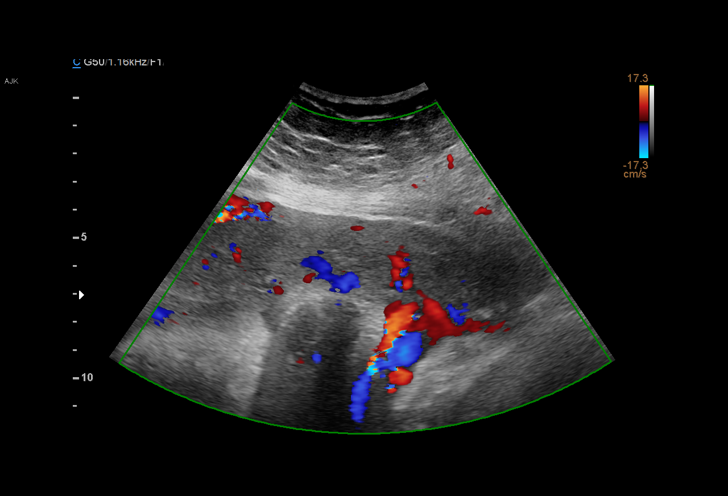
[im 11/56]
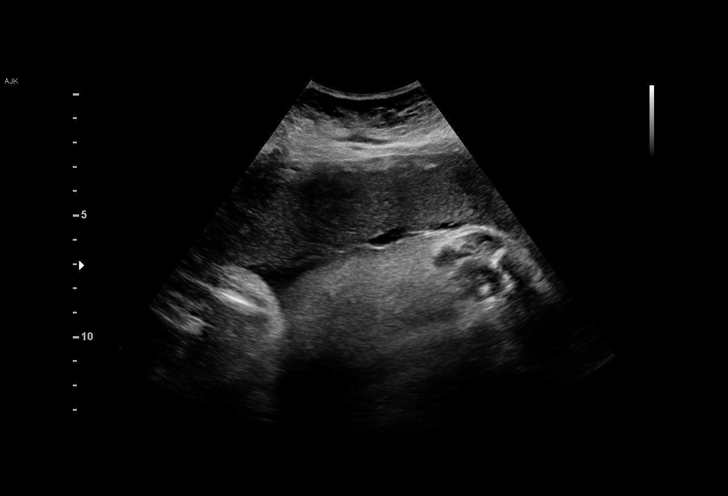
[im 15/56]
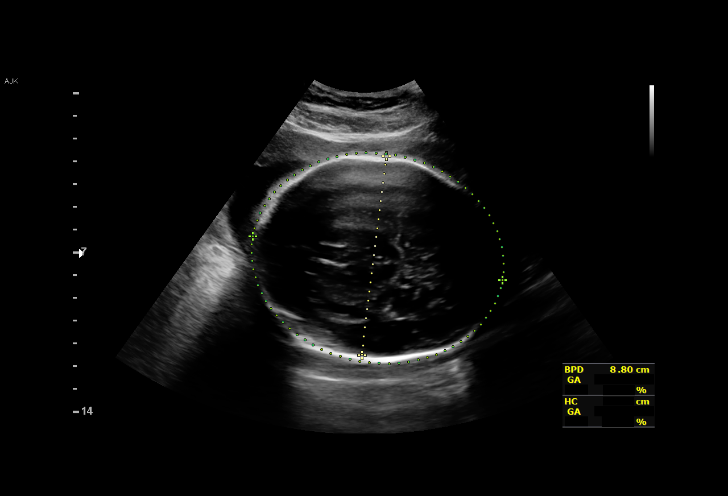
[im 19/56]
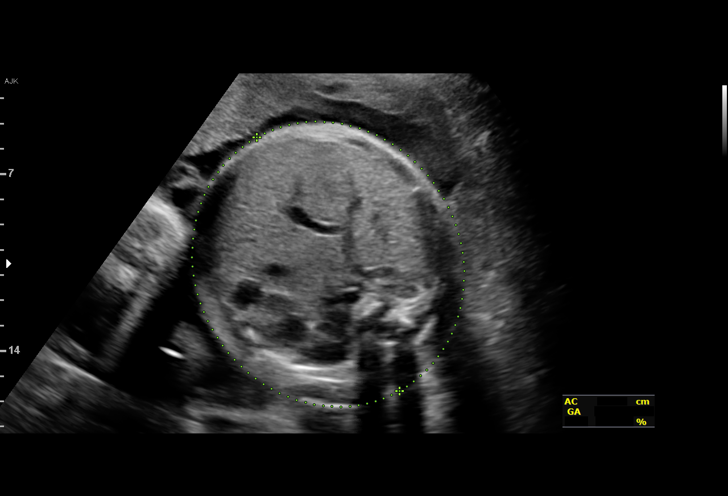
[im 23/56]
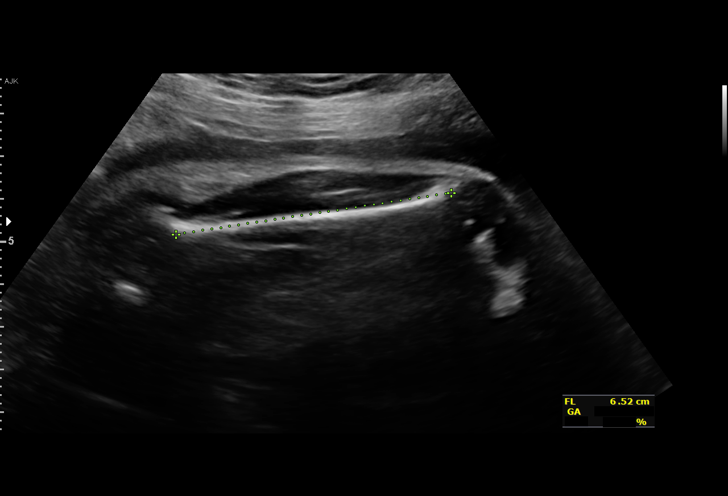
[im 27/56]
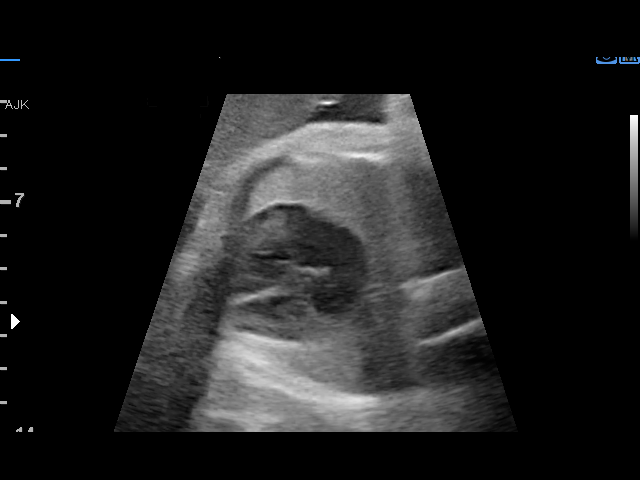
[im 31/56]
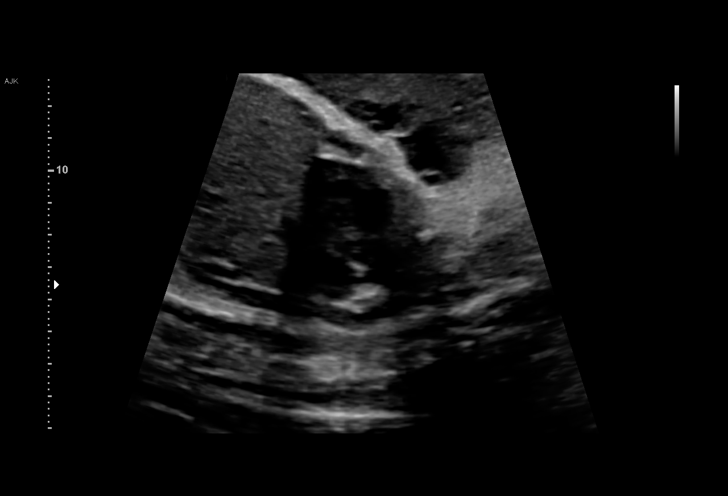
[im 35/56]
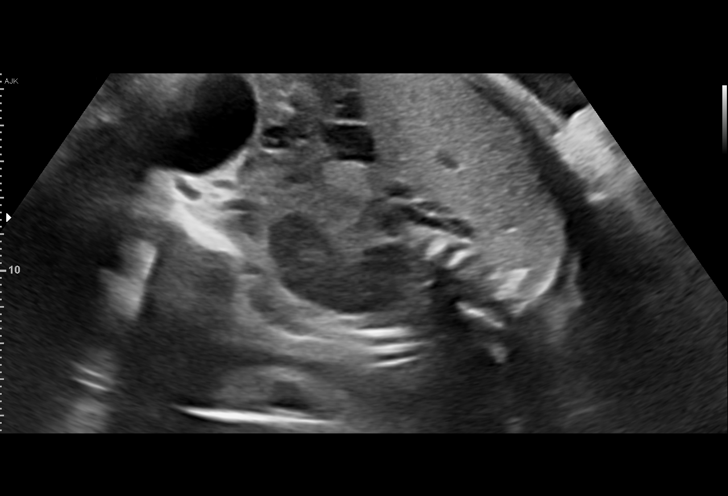
[im 39/56]
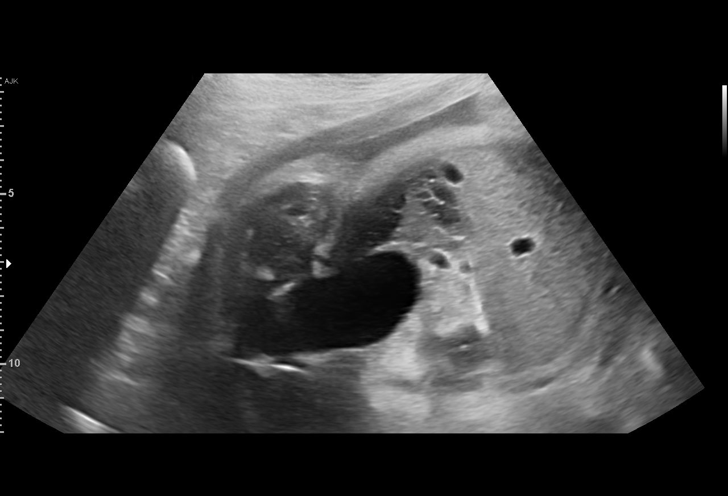
[im 43/56]
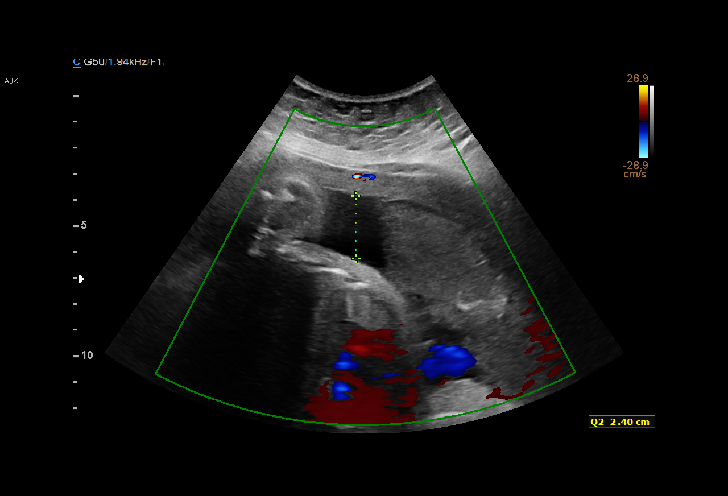
[im 47/56]
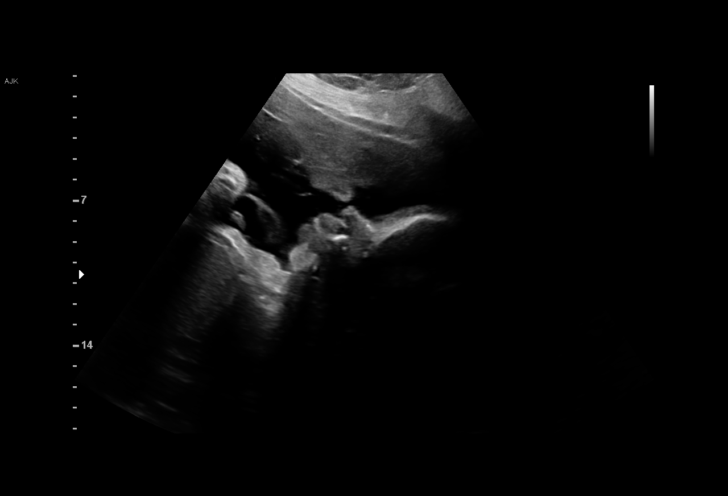
[im 51/56]
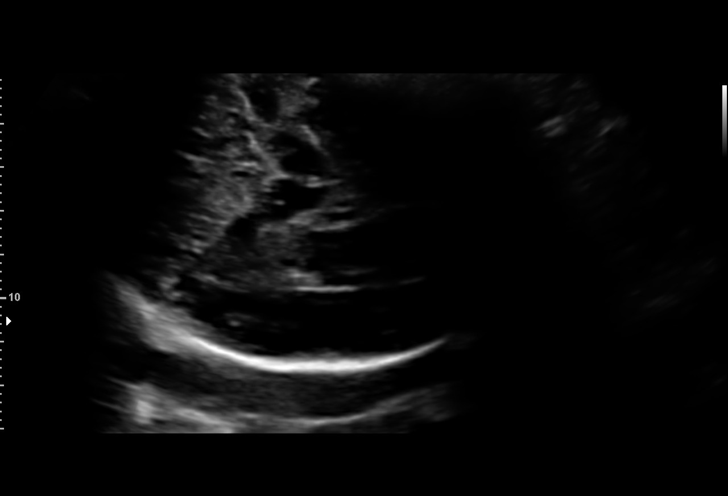
[im 56/56]
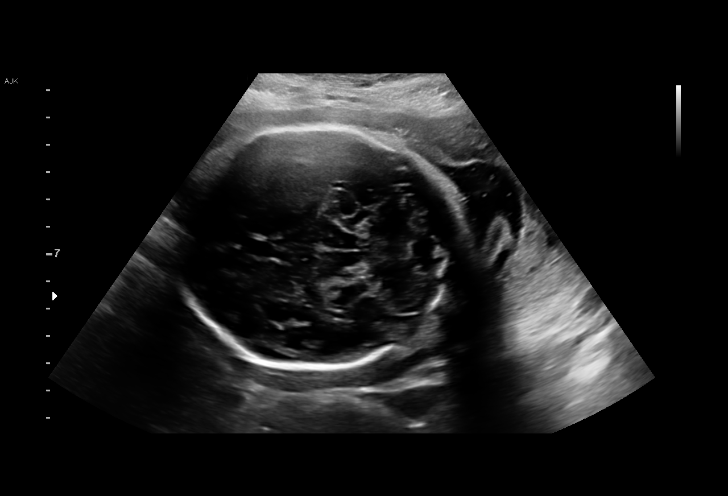

[14 of 28 positions shown; findings below may reference images not displayed]

Terrace

1  GU NEW JSHEUNSHDNEHBDH          [id number]      [id number]     [id number]
Indications

34 weeks gestation of pregnancy
Encounter for antenatal screening for
malformations
Hypertension - Gestational
OB History

Gravidity:    2         Term:   0        Prem:   0         SAB:   0
TOP:          1       Ectopic:  0        Living: 0
Fetal Evaluation

Num Of Fetuses:     1
Fetal Heart         137
Rate(bpm):
Cardiac Activity:   Observed
Presentation:       Cephalic
Placenta:           Anterior, above cervical os
P. Cord Insertion:  Marginal insertion

Amniotic Fluid
AFI FV:      Subjectively within normal limits

AFI Sum(cm)     %Tile       Largest Pocket(cm)
10.61           24

RUQ(cm)       RLQ(cm)       LUQ(cm)        LLQ(cm)
3.17
Biometry
BPD:        88  mm     G. Age:  35w 4d         83  %    CI:          71.5  %    70 - 86
FL/HC:       19.9  %    19.4 -
HC:      331.4  mm     G. Age:  37w 5d         91  %    HC/AC:       0.96       0.96 -
AC:      343.7  mm     G. Age:  38w 2d       > 97  %    FL/BPD:      74.8  %    71 - 87
FL:       65.8  mm     G. Age:  33w 6d         31  %    FL/AC:       19.1  %    20 - 24

Est. FW:    6275   gm    6 lb 12 oz   > 90  %
Gestational Age

LMP:           34w 2d        Date:  02/04/16                 EDD:    11/10/16
U/S Today:     36w 3d                                        EDD:    10/26/16
Best:          34w 2d     Det. By:  LMP  (02/04/16)          EDD:    11/10/16
Anatomy

Cranium:               Appears normal         Aortic Arch:            Appears normal
Cavum:                 Appears normal         Ductal Arch:            Not well visualized
Ventricles:            Appears normal         Diaphragm:              Appears normal
Choroid Plexus:        Not well visualized    Stomach:                Appears normal, left
sided
Cerebellum:            Appears normal         Abdomen:                Appears normal
Posterior Fossa:       Appears normal         Abdominal Wall:         Not well visualized
Nuchal Fold:           Not applicable (>20    Cord Vessels:           Appears normal (3
wks GA)                                        vessel cord)
Face:                  Orbits nl; profile not Kidneys:                Appear normal
well visualized
Lips:                  Appears normal         Bladder:                Appears normal
Thoracic:              Appears normal         Spine:                  Not well visualized
Heart:                 Appears normal         Upper Extremities:      Not well visualized
(4CH, axis, and situs
RVOT:                  Appears normal         Lower Extremities:      Not well visualized
LVOT:                  Appears normal

Other:  Gender not well visualized. Technically difficult due to advanced GA
and fetal position.
Cervix Uterus Adnexa

Cervix
Not visualized (advanced GA >48wks)

Uterus
No abnormality visualized.

Left Ovary
Not visualized.

Right Ovary
Not visualized.

Adnexa:       No abnormality visualized.
Impression

SIUP at 34+2 weeks
Cephalic presentation
Normal detailed fetal anatomy; limited views of profile, DA,
CI, spine and details of extremities
Normal amniotic fluid volume
Measurements consistent with LMP dating; EFW > 90th
%tile; AC > 97th %tile; 6+12
Recommendations

Follow-up as clinically indicated

## 2018-10-22 DIAGNOSIS — Z30432 Encounter for removal of intrauterine contraceptive device: Secondary | ICD-10-CM | POA: Diagnosis not present

## 2018-11-24 ENCOUNTER — Other Ambulatory Visit: Payer: Self-pay

## 2018-11-24 ENCOUNTER — Ambulatory Visit (INDEPENDENT_AMBULATORY_CARE_PROVIDER_SITE_OTHER): Payer: BLUE CROSS/BLUE SHIELD | Admitting: Family Medicine

## 2018-11-24 ENCOUNTER — Encounter: Payer: Self-pay | Admitting: Family Medicine

## 2018-11-24 VITALS — BP 142/98 | HR 98 | Temp 99.0°F | Resp 18 | Ht 65.5 in | Wt 241.8 lb

## 2018-11-24 DIAGNOSIS — Z3201 Encounter for pregnancy test, result positive: Secondary | ICD-10-CM | POA: Diagnosis not present

## 2018-11-24 DIAGNOSIS — O131 Gestational [pregnancy-induced] hypertension without significant proteinuria, first trimester: Secondary | ICD-10-CM | POA: Diagnosis not present

## 2018-11-24 DIAGNOSIS — Z3491 Encounter for supervision of normal pregnancy, unspecified, first trimester: Secondary | ICD-10-CM

## 2018-11-24 LAB — BASIC METABOLIC PANEL
BUN: 13 mg/dL (ref 6–23)
CO2: 23 meq/L (ref 19–32)
CREATININE: 0.64 mg/dL (ref 0.40–1.20)
Calcium: 9.4 mg/dL (ref 8.4–10.5)
Chloride: 105 mEq/L (ref 96–112)
GFR: 110.04 mL/min (ref >60.00)
GLUCOSE: 128 mg/dL — AB (ref 70–99)
POTASSIUM: 3.9 meq/L (ref 3.5–5.1)
Sodium: 136 mEq/L (ref 135–145)

## 2018-11-24 LAB — HCG, QUANTITATIVE, PREGNANCY: Quantitative HCG: 96.92 m[IU]/mL

## 2018-11-24 MED ORDER — LABETALOL HCL 100 MG PO TABS: 100.0000 mg | ORAL_TABLET | Freq: Two times a day (BID) | ORAL | 2 refills | Status: AC

## 2018-11-24 NOTE — Progress Notes (Signed)
Subjective:    Patient ID: Regina Higgins, female    DOB: 12-22-1990, 28 y.o.   MRN: [id number]  HPI   Patient presents of mid to establish with PCP.  Patient is currently pregnant.  She guesstimates her pregnancy around 4 weeks based on last.  And when her IUD was removed.  Patient states she became pregnant very quickly with her first child after IUD removed as well.  Patient's first child is 73 years old.  Patient reports a history of gestational hypertension/preeclampsia.  Did not have issues with BP until third trimester pregnancy.  Patient had blood pressure checked last week and remembers reading being in the 130s over 90s.  Patient denies any issues with chest pain, shortness of breath, lower extremity swelling, racing heart.  Currently takes no medications.  Patient Active Problem List   Diagnosis Date Noted  . Pre-eclampsia 10/14/2016  . Gestational hypertension 09/30/2016   Social History   Tobacco Use  . Smoking status: Never Smoker  . Smokeless tobacco: Never Used  Substance Use Topics  . Alcohol use: No   Past Surgical History:  Procedure Laterality Date  . breast lump removed  08/2008   Rt breast  . CESAREAN SECTION N/A 10/16/2016   Procedure: CESAREAN SECTION;  Surgeon: Richarda Overlie, MD;  Location: [facility] BIRTHING SUITES;  Service: Obstetrics;  Laterality: N/A;   Family History  Problem Relation Age of Onset  . Hypertension Mother   . Diabetes Mother     Review of Systems   Constitutional: Negative for chills, fatigue and fever.  HENT: Negative for congestion, ear pain, sinus pain and sore throat.   Eyes: Negative.   Respiratory: Negative for cough, shortness of breath and wheezing.   Cardiovascular: Negative for chest pain, palpitations and leg swelling.  Gastrointestinal: Negative for abdominal pain, diarrhea, nausea and vomiting.  Genitourinary: Negative for dysuria, frequency and urgency.  Musculoskeletal: Negative for arthralgias and myalgias.  Skin:  Negative for color change, pallor and rash.  Neurological: Negative for syncope, light-headedness and headaches.  Psychiatric/Behavioral: The patient is not nervous/anxious.       Objective:   Physical Exam Vitals signs and nursing note reviewed.  Constitutional:      General: She is not in acute distress.    Appearance: She is well-developed. She is not toxic-appearing or diaphoretic.  HENT:     Head: Normocephalic and atraumatic.     Right Ear: Tympanic membrane, ear canal and external ear normal.     Left Ear: Tympanic membrane, ear canal and external ear normal.     Nose: Nose normal.     Mouth/Throat:     Mouth: Mucous membranes are moist.  Eyes:     General: No scleral icterus.       Right eye: No discharge.        Left eye: No discharge.     Extraocular Movements: Extraocular movements intact.     Conjunctiva/sclera: Conjunctivae normal.     Pupils: Pupils are equal, round, and reactive to light.  Neck:     Musculoskeletal: Normal range of motion and neck supple.     Trachea: No tracheal deviation.  Cardiovascular:     Rate and Rhythm: Normal rate and regular rhythm.     Heart sounds: Normal heart sounds. No murmur. No friction rub. No gallop.   Pulmonary:     Effort: Pulmonary effort is normal. No respiratory distress.     Breath sounds: Normal breath sounds. No wheezing, rhonchi or rales.  Abdominal:     General: Bowel sounds are normal.     Palpations: Abdomen is soft.     Tenderness: There is no abdominal tenderness. There is no guarding.  Musculoskeletal: Normal range of motion.     Right lower leg: No edema.     Left lower leg: No edema.  Lymphadenopathy:     Cervical: No cervical adenopathy.  Skin:    General: Skin is warm and dry.     Capillary Refill: Capillary refill takes less than 2 seconds.     Coloration: Skin is not jaundiced or pale.     Findings: No erythema.  Neurological:     Mental Status: She is alert and oriented to person, place, and  time.     Cranial Nerves: No cranial nerve deficit.  Psychiatric:        Behavior: Behavior normal.        Thought Content: Thought content normal.    Vitals:   11/24/18 1318 11/24/18 1331  BP: (!) 140/100 (!) 142/98  Pulse: 98   Resp: 18   Temp: 99 F (37.2 C)   SpO2: 97%       Assessment & Plan:   Positive urine pregnancy test-due to positive urine pregnancy test at home, we will officially confirm with blood hCG.  Patient advised to begin taking prenatal vitamins once daily again.  Gestational hypertension- BP check x2 in the clinic and both readings are elevated.  Due to patient's history with elevated BP in previous pregnancy, it is quite possible she is having hypertension again.  Due to pregnancy, our BP medication choices are limited.  She will begin labetalol 100 mg twice daily.  DME order given for electronic BP cuff so she can monitor readings at home.  Advised our goal will have her readings always be under 140/90, ideally I would want to see her in the 120s over 70s.  Patient has upcoming appointment with obstetrician on 12/14/2018. BMET drawn in clinic today to look at kidney functions.   Advised that if BP continues to run high even with addition of labetalol, to call and let us know.  Usually I would suggest patient come back in a couple weeks for recheck of BP, but due to coronavirus concerns and patient being pregnant I would prefer not to have her have to come into offense to frequently if avoidable.  Advised that if any symptoms develop such as chest pain, shortness of breath, extremely elevated BP to call office and or go to emergency room for evaluation.

## 2018-11-24 NOTE — Patient Instructions (Signed)
Hypertension During Pregnancy ° °Hypertension is also called high blood pressure. High blood pressure means that the force of your blood moving in your body is too strong. When you are pregnant, this condition should be watched carefully. It can cause problems for you and your baby. °Follow these instructions at home: °Eating and drinking ° °· Drink enough fluid to keep your pee (urine) pale yellow. °· Avoid caffeine. °Lifestyle °· Do not use any products that contain nicotine or tobacco, such as cigarettes and e-cigarettes. If you need help quitting, ask your doctor. °· Do not use alcohol or drugs. °· Avoid stress. °· Rest and get plenty of sleep. °General instructions °· Take over-the-counter and prescription medicines only as told by your doctor. °· While lying down, lie on your left side. This keeps pressure off your major blood vessels. °· While sitting or lying down, raise (elevate) your feet. Try putting some pillows under your lower legs. °· Exercise regularly. Ask your doctor what kinds of exercise are best for you. °· Keep all prenatal and follow-up visits as told by your doctor. This is important. °Contact a doctor if: °· You have symptoms that your doctor told you to watch for, such as: °? Throwing up (vomiting). °? Feeling sick to your stomach (nausea). °? Headache. °Get help right away if you have: °· Very bad belly pain that does not get better with treatment. °· A very bad headache that does not get better. °· Throwing up that does not get better with treatment. °· Sudden, fast weight gain. °· Sudden swelling in your hands, ankles, or face. °· Bleeding from your vagina. °· Blood in your pee. °· Fewer movements from your baby than usual. °· Blurry vision. °· Double vision. °· Muscle twitching. °· Sudden muscle tightening (spasms). °· Trouble breathing. °· Blue fingernails or lips. °Summary °· Hypertension is also called high blood pressure. High blood pressure means that the force of your blood moving  in your body is too strong. °· When you are pregnant, this condition should be watched carefully. It can cause problems for you and your baby. °· Get help right away if you have symptoms that your doctor told you to watch for. °This information is not intended to replace advice given to you by your health care provider. Make sure you discuss any questions you have with your health care provider. °Document Released: 09/28/2010 Document Revised: 08/12/2017 Document Reviewed: 05/07/2016 °Elsevier Interactive Patient Education © 2019 [employer] ° °

## 2018-12-14 DIAGNOSIS — N911 Secondary amenorrhea: Secondary | ICD-10-CM | POA: Diagnosis not present

## 2018-12-22 DIAGNOSIS — Z3481 Encounter for supervision of other normal pregnancy, first trimester: Secondary | ICD-10-CM | POA: Diagnosis not present

## 2018-12-22 DIAGNOSIS — Z3685 Encounter for antenatal screening for Streptococcus B: Secondary | ICD-10-CM | POA: Diagnosis not present

## 2018-12-22 LAB — OB RESULTS CONSOLE ABO/RH: RH Type: POSITIVE

## 2018-12-22 LAB — OB RESULTS CONSOLE RUBELLA ANTIBODY, IGM: Rubella: IMMUNE

## 2018-12-22 LAB — OB RESULTS CONSOLE HEPATITIS B SURFACE ANTIGEN: Hepatitis B Surface Ag: NEGATIVE

## 2018-12-22 LAB — OB RESULTS CONSOLE ANTIBODY SCREEN: Antibody Screen: NEGATIVE

## 2018-12-22 LAB — OB RESULTS CONSOLE RPR: RPR: NONREACTIVE

## 2018-12-22 LAB — OB RESULTS CONSOLE GC/CHLAMYDIA
Chlamydia: NEGATIVE
Gonorrhea: NEGATIVE

## 2018-12-22 LAB — OB RESULTS CONSOLE HIV ANTIBODY (ROUTINE TESTING): HIV: NONREACTIVE

## 2019-01-05 DIAGNOSIS — Z113 Encounter for screening for infections with a predominantly sexual mode of transmission: Secondary | ICD-10-CM | POA: Diagnosis not present

## 2019-01-05 DIAGNOSIS — Z3400 Encounter for supervision of normal first pregnancy, unspecified trimester: Secondary | ICD-10-CM | POA: Diagnosis not present

## 2019-01-19 DIAGNOSIS — Z3682 Encounter for antenatal screening for nuchal translucency: Secondary | ICD-10-CM | POA: Diagnosis not present

## 2019-01-19 DIAGNOSIS — Z3482 Encounter for supervision of other normal pregnancy, second trimester: Secondary | ICD-10-CM | POA: Diagnosis not present

## 2019-01-19 DIAGNOSIS — Z3A12 12 weeks gestation of pregnancy: Secondary | ICD-10-CM | POA: Diagnosis not present

## 2019-03-03 DIAGNOSIS — Z363 Encounter for antenatal screening for malformations: Secondary | ICD-10-CM | POA: Diagnosis not present

## 2019-03-03 DIAGNOSIS — Z361 Encounter for antenatal screening for raised alphafetoprotein level: Secondary | ICD-10-CM | POA: Diagnosis not present

## 2019-03-03 DIAGNOSIS — Z3A18 18 weeks gestation of pregnancy: Secondary | ICD-10-CM | POA: Diagnosis not present

## 2019-03-03 DIAGNOSIS — Z3480 Encounter for supervision of other normal pregnancy, unspecified trimester: Secondary | ICD-10-CM | POA: Diagnosis not present

## 2019-03-31 DIAGNOSIS — Z362 Encounter for other antenatal screening follow-up: Secondary | ICD-10-CM | POA: Diagnosis not present

## 2019-03-31 DIAGNOSIS — Z3A22 22 weeks gestation of pregnancy: Secondary | ICD-10-CM | POA: Diagnosis not present

## 2019-04-22 ENCOUNTER — Other Ambulatory Visit: Payer: Self-pay

## 2019-04-22 ENCOUNTER — Inpatient Hospital Stay (HOSPITAL_COMMUNITY)
Admission: AD | Admit: 2019-04-22 | Discharge: 2019-04-22 | Disposition: A | Discharge status: Home / Self Care | Payer: BC Managed Care – PPO | Attending: Obstetrics and Gynecology | Admitting: Obstetrics and Gynecology

## 2019-04-22 ENCOUNTER — Encounter (HOSPITAL_COMMUNITY): Payer: Self-pay | Admitting: *Deleted

## 2019-04-22 DIAGNOSIS — O162 Unspecified maternal hypertension, second trimester: Secondary | ICD-10-CM | POA: Diagnosis not present

## 2019-04-22 DIAGNOSIS — O368120 Decreased fetal movements, second trimester, not applicable or unspecified: Secondary | ICD-10-CM

## 2019-04-22 DIAGNOSIS — Z3A25 25 weeks gestation of pregnancy: Secondary | ICD-10-CM | POA: Diagnosis not present

## 2019-04-22 DIAGNOSIS — O132 Gestational [pregnancy-induced] hypertension without significant proteinuria, second trimester: Secondary | ICD-10-CM | POA: Diagnosis not present

## 2019-04-22 LAB — CBC
HCT: 34.4 % — ABNORMAL LOW (ref 36.0–46.0)
Hemoglobin: 11.8 g/dL — ABNORMAL LOW (ref 12.0–15.0)
MCH: 29.6 pg (ref 26.0–34.0)
MCHC: 34.3 g/dL (ref 30.0–36.0)
MCV: 86.2 fL (ref 80.0–100.0)
Platelets: 233 10*3/uL (ref 150–400)
RBC: 3.99 MIL/uL (ref 3.87–5.11)
RDW: 12.5 % (ref 11.5–15.5)
WBC: 8.8 10*3/uL (ref 4.0–10.5)
nRBC: 0 % (ref 0.0–0.2)

## 2019-04-22 LAB — PROTEIN / CREATININE RATIO, URINE
Creatinine, Urine: 42.4 mg/dL
Total Protein, Urine: <6 mg/dL

## 2019-04-22 LAB — COMPREHENSIVE METABOLIC PANEL
ALT: 13 U/L (ref 0–44)
AST: 13 U/L — ABNORMAL LOW (ref 15–41)
Albumin: 3 g/dL — ABNORMAL LOW (ref 3.5–5.0)
Alkaline Phosphatase: 55 U/L (ref 38–126)
Anion gap: 9 (ref 5–15)
BUN: 7 mg/dL (ref 6–20)
CO2: 19 mmol/L — ABNORMAL LOW (ref 22–32)
Calcium: 9 mg/dL (ref 8.9–10.3)
Chloride: 105 mmol/L (ref 98–111)
Creatinine, Ser: 0.61 mg/dL (ref 0.44–1.00)
GFR calc Af Amer: >60 mL/min (ref >60)
GFR calc non Af Amer: >60 mL/min (ref >60)
Glucose, Bld: 106 mg/dL — ABNORMAL HIGH (ref 70–99)
Potassium: 3.7 mmol/L (ref 3.5–5.1)
Sodium: 133 mmol/L — ABNORMAL LOW (ref 135–145)
Total Bilirubin: 0.3 mg/dL (ref 0.3–1.2)
Total Protein: 6.1 g/dL — ABNORMAL LOW (ref 6.5–8.1)

## 2019-04-22 LAB — URINALYSIS, ROUTINE W REFLEX MICROSCOPIC
Bilirubin Urine: NEGATIVE
Glucose, UA: NEGATIVE mg/dL
Hgb urine dipstick: NEGATIVE
Ketones, ur: NEGATIVE mg/dL
Leukocytes,Ua: NEGATIVE
Nitrite: NEGATIVE
Protein, ur: NEGATIVE mg/dL
Specific Gravity, Urine: 1.008 (ref 1.005–1.030)
pH: 6 (ref 5.0–8.0)

## 2019-04-22 NOTE — MAU Note (Signed)
Pt reports to MAU c/o DFM. Pt reports only feeling her move <10 times all day. No FM in the last two hours. No pain or bleeding or LOF.

## 2019-04-22 NOTE — Discharge Instructions (Signed)
Hypertension During Pregnancy °High blood pressure (hypertension) is when the force of blood pumping through the arteries is too strong. Arteries are blood vessels that carry blood from the heart throughout the body. Hypertension during pregnancy can be mild or severe. Severe hypertension during pregnancy (preeclampsia) is a medical emergency that requires prompt evaluation and treatment. °Different types of hypertension can happen during pregnancy. These include: °· Chronic hypertension. This happens when you had high blood pressure before you became pregnant, and it continues during the pregnancy. Hypertension that develops before you are [redacted] weeks pregnant and continues during the pregnancy is also called chronic hypertension. If you have chronic hypertension, it will not go away after you have your baby. You will need follow-up visits with your health care provider after you have your baby. Your doctor may want you to keep taking medicine for your blood pressure. °· Gestational hypertension. This is hypertension that develops after the 20th week of pregnancy. Gestational hypertension usually goes away after you have your baby, but your health care provider will need to monitor your blood pressure to make sure that it is getting better. °· Preeclampsia. This is severe hypertension during pregnancy. This can cause serious complications for you and your baby and can also cause complications for you after the delivery of your baby. °· Postpartum preeclampsia. You may develop severe hypertension after giving birth. This usually occurs within 48 hours after childbirth but may occur up to 6 weeks after giving birth. This is rare. °How does this affect me? °Women who have hypertension during pregnancy have a greater chance of developing hypertension later in life or during future pregnancies. In some cases, hypertension during pregnancy can cause serious complications, such as: °· Stroke. °· Heart attack. °· Injury to  other organs, such as kidneys, lungs, or liver. °· Preeclampsia. °· Convulsions or seizures. °· Placental abruption. °How does this affect my baby? °Hypertension during pregnancy can affect your baby. Your baby may: °· Be born early (prematurely). °· Not weigh as much as he or she should at birth (low birth weight). °· Not tolerate labor well, leading to an unplanned cesarean delivery. °What are the risks? °There are certain factors that make it more likely for you to develop hypertension during pregnancy. These include: °· Having hypertension during a previous pregnancy. °· Being overweight. °· Being age 35 or older. °· Being pregnant for the first time. °· Being pregnant with more than one baby. °· Becoming pregnant using fertilization methods, such as IVF (in vitro fertilization). °· Having other medical problems, such as diabetes, kidney disease, or lupus. °· Having a family history of hypertension. °What can I do to lower my risk? °The exact cause of hypertension during pregnancy is not known. You may be able to lower your risk by: °· Maintaining a healthy weight. °· Eating a healthy and balanced diet. °· Following your health care provider's instructions about treating any long-term conditions that you had before becoming pregnant. °It is very important to keep all of your prenatal care appointments. Your health care provider will check your blood pressure and make sure that your pregnancy is progressing as expected. If a problem is found, early treatment can prevent complications. °How is this treated? °Treatment for hypertension during pregnancy varies depending on the type of hypertension you have and how serious it is. °· If you were taking medicine for high blood pressure before you became pregnant, talk with your health care provider. You may need to change medicine during pregnancy because   some medicines, like ACE inhibitors, may not be considered safe for your baby.  If you have gestational  hypertension, your health care provider may order medicine to treat this during pregnancy.  If you are at risk for preeclampsia, your health care provider may recommend that you take a low-dose aspirin during your pregnancy.  If you have severe hypertension, you may need to be hospitalized so you and your baby can be monitored closely. You may also need to be given medicine to lower your blood pressure. This medicine may be given by mouth or through an IV.  In some cases, if your condition gets worse, you may need to deliver your baby early. Follow these instructions at home: Eating and drinking   Drink enough fluid to keep your urine pale yellow.  Avoid caffeine. Lifestyle  Do not use any products that contain nicotine or tobacco, such as cigarettes, e-cigarettes, and chewing tobacco. If you need help quitting, ask your health care provider.  Do not use alcohol or drugs.  Avoid stress as much as possible.  Rest and get plenty of sleep.  Regular exercise can help to reduce your blood pressure. Ask your health care provider what kinds of exercise are best for you. General instructions  Take over-the-counter and prescription medicines only as told by your health care provider.  Keep all prenatal and follow-up visits as told by your health care provider. This is important. Contact a health care provider if:  You have symptoms that your health care provider told you may require more treatment or monitoring, such as: ? Headaches. ? Nausea or vomiting. ? Abdominal pain. ? Dizziness. ? Light-headedness. Get help right away if:  You have: ? Severe abdominal pain that does not get better with treatment. ? A severe headache that does not get better. ? Vomiting that does not get better. ? Sudden, rapid weight gain. ? Sudden swelling in your hands, ankles, or face. ? Vaginal bleeding. ? Blood in your urine. ? Blurred or double vision. ? Shortness of breath or chest  pain. ? Weakness on one side of your body. ? Difficulty speaking.  Your baby is not moving as much as usual. Summary  High blood pressure (hypertension) is when the force of blood pumping through the arteries is too strong.  Hypertension during pregnancy can cause problems for you and your baby.  Treatment for hypertension during pregnancy varies depending on the type of hypertension you have and how serious it is.  Keep all prenatal and follow-up visits as told by your health care provider. This is important. This information is not intended to replace advice given to you by your health care provider. Make sure you discuss any questions you have with your health care provider. Document Released: 05/14/2011 Document Revised: 12/17/2018 Document Reviewed: 09/22/2018 Elsevier Patient Education  2020 [employer]  Second Trimester of Pregnancy  The second trimester is from week 14 through week 27 (month 4 through 6). This is often the time in pregnancy that you feel your best. Often times, morning sickness has lessened or quit. You may have more energy, and you may get hungry more often. Your unborn baby is growing rapidly. At the end of the sixth month, he or she is about 9 inches long and weighs about 1 pounds. You will likely feel the baby move between 18 and 20 weeks of pregnancy. Follow these instructions at home: Medicines  Take over-the-counter and prescription medicines only as told by your doctor. Some medicines are  safe and some medicines are not safe during pregnancy.  Take a prenatal vitamin that contains at least 600 micrograms (mcg) of folic acid.  If you have trouble pooping (constipation), take medicine that will make your stool soft (stool softener) if your doctor approves. Eating and drinking   Eat regular, healthy meals.  Avoid raw meat and uncooked cheese.  If you get low calcium from the food you eat, talk to your doctor about taking a daily calcium  supplement.  Avoid foods that are high in fat and sugars, such as fried and sweet foods.  If you feel sick to your stomach (nauseous) or throw up (vomit): ? Eat 4 or 5 small meals a day instead of 3 large meals. ? Try eating a few soda crackers. ? Drink liquids between meals instead of during meals.  To prevent constipation: ? Eat foods that are high in fiber, like fresh fruits and vegetables, whole grains, and beans. ? Drink enough fluids to keep your pee (urine) clear or pale yellow. Activity  Exercise only as told by your doctor. Stop exercising if you start to have cramps.  Do not exercise if it is too hot, too humid, or if you are in a place of great height (high altitude).  Avoid heavy lifting.  Wear low-heeled shoes. Sit and stand up straight.  You can continue to have sex unless your doctor tells you not to. Relieving pain and discomfort  Wear a good support bra if your breasts are tender.  Take warm water baths (sitz baths) to soothe pain or discomfort caused by hemorrhoids. Use hemorrhoid cream if your doctor approves.  Rest with your legs raised if you have leg cramps or low back pain.  If you develop puffy, bulging veins (varicose veins) in your legs: ? Wear support hose or compression stockings as told by your doctor. ? Raise (elevate) your feet for 15 minutes, 3-4 times a day. ? Limit salt in your food. Prenatal care  Write down your questions. Take them to your prenatal visits.  Keep all your prenatal visits as told by your doctor. This is important. Safety  Wear your seat belt when driving.  Make a list of emergency phone numbers, including numbers for family, friends, the hospital, and police and fire departments. General instructions  Ask your doctor about the right foods to eat or for help finding a counselor, if you need these services.  Ask your doctor about local prenatal classes. Begin classes before month 6 of your pregnancy.  Do not use hot  tubs, steam rooms, or saunas.  Do not douche or use tampons or scented sanitary pads.  Do not cross your legs for long periods of time.  Visit your dentist if you have not done so. Use a soft toothbrush to brush your teeth. Floss gently.  Avoid all smoking, herbs, and alcohol. Avoid drugs that are not approved by your doctor.  Do not use any products that contain nicotine or tobacco, such as cigarettes and e-cigarettes. If you need help quitting, ask your doctor.  Avoid cat litter boxes and soil used by cats. These carry germs that can cause birth defects in the baby and can cause a loss of your baby (miscarriage) or stillbirth. Contact a doctor if:  You have mild cramps or pressure in your lower belly.  You have pain when you pee (urinate).  You have bad smelling fluid coming from your vagina.  You continue to feel sick to your stomach (nauseous), throw  up (vomit), or have watery poop (diarrhea).  You have a nagging pain in your belly area.  You feel dizzy. Get help right away if:  You have a fever.  You are leaking fluid from your vagina.  You have spotting or bleeding from your vagina.  You have severe belly cramping or pain.  You lose or gain weight rapidly.  You have trouble catching your breath and have chest pain.  You notice sudden or extreme puffiness (swelling) of your face, hands, ankles, feet, or legs.  You have not felt the baby move in over an hour.  You have severe headaches that do not go away when you take medicine.  You have trouble seeing. Summary  The second trimester is from week 14 through week 27 (months 4 through 6). This is often the time in pregnancy that you feel your best.  To take care of yourself and your unborn baby, you will need to eat healthy meals, take medicines only if your doctor tells you to do so, and do activities that are safe for you and your baby.  Call your doctor if you get sick or if you notice anything unusual about  your pregnancy. Also, call your doctor if you need help with the right food to eat, or if you want to know what activities are safe for you. This information is not intended to replace advice given to you by your health care provider. Make sure you discuss any questions you have with your health care provider. Document Released: 11/20/2009 Document Revised: 12/18/2018 Document Reviewed: 10/01/2016 Elsevier Patient Education  2020 [employer].

## 2019-04-22 NOTE — MAU Provider Note (Signed)
Chief Complaint:  Decreased Fetal Movement   First Provider Initiated Contact with Patient 04/22/19 2151      HPI: Regina Higgins is a 28 y.o. [id number] at 4825w4dwho presents to maternity admissions reporting decreased fetal movement today.  She denies LOF, vaginal bleeding, vaginal itching/burning, urinary symptoms, h/a, dizziness, n/v, diarrhea, constipation or fever/chills.  She denies headache, visual changes or RUQ abdominal pain.  RN note: Pt reports to MAU c/o DFM. Pt reports only feeling her move <10 times all day. No FM in the last two hours. No pain or bleeding or LOF  Past Medical History: Past Medical History:  Diagnosis Date  . Anemia   . Chicken pox   . Hypertension   . Migraines     Past obstetric history: OB History  Gravida Para Term Preterm AB Living  3 1   1 1 1   SAB TAB Ectopic Multiple Live Births    1   0 1    # Outcome Date GA Lbr Len/2nd Weight Sex Delivery Anes PTL Lv  3 Current           2 Preterm 10/16/16 [id number]  2835 g M CS-LTranv Spinal  LIV  1 TAB             Past Surgical History: Past Surgical History:  Procedure Laterality Date  . breast lump removed  08/2008   Rt breast  . CESAREAN SECTION N/A 10/16/2016   Procedure: CESAREAN SECTION;  Surgeon: Richarda Overlieichard Holland, MD;  [facility];  Service: Obstetrics;  Laterality: N/A;    Family History: Family History  Problem Relation Age of Onset  . Hypertension Mother   . Diabetes Mother     Social History: Social History   Tobacco Use  . Smoking status: Never Smoker  . Smokeless tobacco: Never Used  Substance Use Topics  . Alcohol use: No  . Drug use: No    Allergies: No Known Allergies  Meds:  Medications Prior to Admission  Medication Sig Dispense Refill Last Dose  . aspirin 81 MG chewable tablet Chew by mouth daily.   04/21/2019 at Unknown time  . Prenatal Vit-Fe Fumarate-FA (MULTIVITAMIN-PRENATAL) 27-0.8 MG TABS tablet Take 1 tablet by mouth daily at 12 noon.    04/21/2019 at Unknown time  . labetalol (NORMODYNE) 100 MG tablet Take 1 tablet (100 mg total) by mouth 2 (two) times daily. 60 tablet 2 More than a month at Unknown time    I have reviewed patient's Past Medical Hx, Surgical Hx, Family Hx, Social Hx, medications and allergies.   ROS:  Review of Systems  Constitutional: Negative for chills and fever.  Gastrointestinal: Negative for abdominal pain, constipation, diarrhea and nausea.  Genitourinary: Negative for vaginal bleeding.  Musculoskeletal: Negative for back pain.   Other systems negative  Physical Exam   Patient Vitals for the past 24 hrs:  BP Temp Temp src Pulse Resp Weight  04/22/19 2146 (!) 151/81 - - (!) 112 - -  04/22/19 2131 (!) 154/86 - - (!) 120 - -  04/22/19 2117 (!) 140/95 98 F (36.7 C) Oral (!) 110 19 114.9 kg  . Vitals:   04/22/19 2215 04/22/19 2230 04/22/19 2245 04/22/19 2306  BP: 135/71 139/64 (!) 141/72   Pulse: (!) 103 (!) 101 (!) 102   Resp:    19  Temp:    98.5 F (36.9 C)  TempSrc:    Oral  Weight:        Constitutional: Well-developed, well-nourished female  in no acute distress.  Cardiovascular: normal rate and rhythm Respiratory: normal effort, clear to auscultation bilaterally GI: Abd soft, non-tender, gravid appropriate for gestational age.   No rebound or guarding. MS: Extremities nontender, no edema, normal ROM Neurologic: Alert and oriented x 4.   DTRs 2+ with no clonus GU: Neg CVAT.  PELVIC EXAM:  deferred  FHT:  Baseline 140 , moderate variability, accelerations present, no decelerations Contractions: Occasional    Labs: Results for orders placed or performed during the hospital encounter of 04/22/19 (from the past 24 hour(s))  Urinalysis, Routine w reflex microscopic     Status: Abnormal   Collection Time: 04/22/19  9:21 PM  Result Value Ref Range   Color, Urine STRAW (A) YELLOW   APPearance CLEAR CLEAR   Specific Gravity, Urine 1.008 1.005 - 1.030   pH 6.0 5.0 - 8.0    Glucose, UA NEGATIVE NEGATIVE mg/dL   Hgb urine dipstick NEGATIVE NEGATIVE   Bilirubin Urine NEGATIVE NEGATIVE   Ketones, ur NEGATIVE NEGATIVE mg/dL   Protein, ur NEGATIVE NEGATIVE mg/dL   Nitrite NEGATIVE NEGATIVE   Leukocytes,Ua NEGATIVE NEGATIVE  Protein / creatinine ratio, urine     Status: None   Collection Time: 04/22/19  9:21 PM  Result Value Ref Range   Creatinine, Urine 42.40 mg/dL   Total Protein, Urine <6 mg/dL   Protein Creatinine Ratio        0.00 - 0.15 mg/mg[Cre]  CBC     Status: Abnormal   Collection Time: 04/22/19 10:09 PM  Result Value Ref Range   WBC 8.8 4.0 - 10.5 K/uL   RBC 3.99 3.87 - 5.11 MIL/uL   Hemoglobin 11.8 (L) 12.0 - 15.0 g/dL   HCT 34.4 (L) 36.0 - 46.0 %   MCV 86.2 80.0 - 100.0 fL   MCH 29.6 26.0 - 34.0 pg   MCHC 34.3 30.0 - 36.0 g/dL   RDW 12.5 11.5 - 15.5 %   Platelets 233 150 - 400 K/uL   nRBC 0.0 0.0 - 0.2 %  Comprehensive metabolic panel     Status: Abnormal   Collection Time: 04/22/19 10:09 PM  Result Value Ref Range   Sodium 133 (L) 135 - 145 mmol/L   Potassium 3.7 3.5 - 5.1 mmol/L   Chloride 105 98 - 111 mmol/L   CO2 19 (L) 22 - 32 mmol/L   Glucose, Bld 106 (H) 70 - 99 mg/dL   BUN 7 6 - 20 mg/dL   Creatinine, Ser 0.61 0.44 - 1.00 mg/dL   Calcium 9.0 8.9 - 10.3 mg/dL   Total Protein 6.1 (L) 6.5 - 8.1 g/dL   Albumin 3.0 (L) 3.5 - 5.0 g/dL   AST 13 (L) 15 - 41 U/L   ALT 13 0 - 44 U/L   Alkaline Phosphatase 55 38 - 126 U/L   Total Bilirubin 0.3 0.3 - 1.2 mg/dL   GFR calc non Af Amer >60 >60 mL/min   GFR calc Af Amer >60 >60 mL/min   Anion gap 9 5 - 15    Imaging:  No results found.  MAU Course/MDM: I have ordered labs and reviewed results.  BPs noted to be elevated so labs ordered.   Preeclampsia labs are all normal  NST reviewed, reactive throughout.  Feels movement now Reviewed signs of preeclampsia with patient Reviewed fetal movement. .    Assessment: Single intrauterine pregnancy at [redacted]w[redacted]d Decreased fetal movement,  resolved Hypertension in pregnancy, possible chronic hypertension based on previous elevations earlier in  pregnancy  Plan: Discharge home Fetal movement monitoring Preeclampsia precautions Preterm Labor precautions and fetal kick counts Follow up in Office for prenatal visits and recheck  Encouraged to return here or to other Urgent Care/ED if she develops worsening of symptoms, increase in pain, fever, or other concerning symptoms.   Pt stable at time of discharge.  Wynelle BourgeoisMarie Creighton Longley CNM, MSN Certified Nurse-Midwife 04/22/2019 9:52 PM

## 2019-04-30 DIAGNOSIS — Z3480 Encounter for supervision of other normal pregnancy, unspecified trimester: Secondary | ICD-10-CM | POA: Diagnosis not present

## 2019-04-30 DIAGNOSIS — Z23 Encounter for immunization: Secondary | ICD-10-CM | POA: Diagnosis not present

## 2019-05-03 LAB — OB RESULTS CONSOLE HIV ANTIBODY (ROUTINE TESTING): HIV: NONREACTIVE

## 2019-05-10 DIAGNOSIS — O99810 Abnormal glucose complicating pregnancy: Secondary | ICD-10-CM | POA: Diagnosis not present

## 2019-06-11 DIAGNOSIS — Z23 Encounter for immunization: Secondary | ICD-10-CM | POA: Diagnosis not present

## 2019-07-05 DIAGNOSIS — Z3685 Encounter for antenatal screening for Streptococcus B: Secondary | ICD-10-CM | POA: Diagnosis not present

## 2019-07-05 DIAGNOSIS — Z3480 Encounter for supervision of other normal pregnancy, unspecified trimester: Secondary | ICD-10-CM | POA: Diagnosis not present

## 2019-07-07 ENCOUNTER — Telehealth (HOSPITAL_COMMUNITY): Payer: Self-pay | Admitting: *Deleted

## 2019-07-07 NOTE — Telephone Encounter (Signed)
Preadmission screen  

## 2019-07-08 ENCOUNTER — Encounter (HOSPITAL_COMMUNITY): Payer: Self-pay

## 2019-07-08 NOTE — Patient Instructions (Signed)
Regina Higgins  07/08/2019   Your procedure is scheduled on:  07/23/2019  Arrive at [place] at [employer] on [address] at [facility]  and [address]. You are invited to use the FREE valet parking or use the Visitor's parking deck.  Pick up the phone at the desk and dial [phone].  Call this number if you have problems the morning of surgery: [phone]  Remember:   Do not eat food:(After Midnight) Desps de medianoche.  Do not drink clear liquids: (After Midnight) Desps de medianoche.  Take these medicines the morning of surgery with A SIP OF WATER:  Take labetalol as prescribed   Do not wear jewelry, make-up or nail polish.  Do not wear lotions, powders, or perfumes. Do not wear deodorant.  Do not shave 48 hours prior to surgery.  Do not bring valuables to the hospital.  [facility] is not   responsible for any belongings or valuables brought to the hospital.  Contacts, dentures or bridgework may not be worn into surgery.  Leave suitcase in the car. After surgery it may be brought to your room.  For patients admitted to the hospital, checkout time is 11:00 AM the day of              discharge.      Please read over the following fact sheets that you were given:     Preparing for Surgery

## 2019-07-13 NOTE — H&P (Signed)
Regina Higgins is a 28 y.o. female [phone] at 82 weeks presenting for repeat C/S with BTL.  Patient has CHTN and has taken daily low dose ASA this pregnancy; no requirement for anti-hypertensives this pregnancy.  GBS positive.    OB History    Gravida  3   Para  1   Term      Preterm  1   AB  1   Living  1     SAB      TAB  1   Ectopic      Multiple  0   Live Births  1          Past Medical History:  Diagnosis Date  . Anemia   . Chicken pox   . History of gestational hypertension   . Hypertension   . Migraines    Past Surgical History:  Procedure Laterality Date  . breast lump removed  08/2008   Rt breast  . CESAREAN SECTION N/A 10/16/2016   Procedure: CESAREAN SECTION;  Surgeon: Molli Posey, MD;  Location: [facility];  Service: Obstetrics;  Laterality: N/A;   Family History: family history includes Diabetes in her mother; Heart disease in her maternal grandfather and maternal grandmother; Hypertension in her mother. Social History:  reports that she has never smoked. She has never used smokeless tobacco. She reports that she does not drink alcohol or use drugs.     Maternal Diabetes: No Genetic Screening: Normal Maternal Ultrasounds/Referrals: Normal Fetal Ultrasounds or other Referrals:  None Maternal Substance Abuse:  No Significant Maternal Medications:  None Significant Maternal Lab Results:  Group B Strep positive Other Comments:  None  ROS Maternal Medical History:  Prenatal complications: PIH.   Prenatal Complications - Diabetes: none.      unknown if currently breastfeeding. Maternal Exam:  Abdomen: Patient reports no abdominal tenderness. Surgical scars: low transverse.   Fundal height is c/w dates.   Estimated fetal weight is 7#12.       Physical Exam  Constitutional: She is oriented to person, place, and time. She appears well-developed and well-nourished.  Neck: Normal range of motion. Neck supple.  Respiratory:  Effort normal.  GI: Soft. There is no rebound and no guarding.  Neurological: She is alert and oriented to person, place, and time.  Skin: Skin is warm and dry.  Psychiatric: She has a normal mood and affect. Her behavior is normal.    Prenatal labs: ABO, Rh: O/Positive/-- (04/14 0000) Antibody: Negative (04/14 0000) Rubella: Immune (04/14 0000) RPR: Nonreactive (04/14 0000)  HBsAg: Negative (04/14 0000)  HIV: Non-reactive (04/14 0000)  GBS:   Positive  Assessment/Plan: 28yo [id number] at 73 weeks with [facility] and desire for sterility for repeat C/S and BTL Patient has been counseled re: risk of bleeding, infection, scarring, and damage to surrounding structures.  She understands the risk of abnormal placentation and uterine rupture in subsequent preganncies.  She understands the risk of permanence/regret as well as failure/ectopic with BTL.  All quesitons were answered and the patient wishes to proceed.  Linda Hedges 07/13/2019, 9:33 AM

## 2019-07-15 ENCOUNTER — Encounter (HOSPITAL_COMMUNITY)
Admission: AD | Disposition: A | Discharge status: Home / Self Care | Payer: Self-pay | Source: Home / Self Care | Attending: Obstetrics & Gynecology

## 2019-07-15 ENCOUNTER — Inpatient Hospital Stay (HOSPITAL_COMMUNITY): Payer: BC Managed Care – PPO | Admitting: Anesthesiology

## 2019-07-15 ENCOUNTER — Other Ambulatory Visit: Payer: Self-pay

## 2019-07-15 ENCOUNTER — Inpatient Hospital Stay (HOSPITAL_COMMUNITY)
Admission: AD | Admit: 2019-07-15 | Discharge: 2019-07-18 | DRG: 785 | Disposition: A | Discharge status: Home / Self Care | Payer: BC Managed Care – PPO | Attending: Obstetrics & Gynecology | Admitting: Obstetrics & Gynecology

## 2019-07-15 ENCOUNTER — Encounter (HOSPITAL_COMMUNITY): Payer: Self-pay

## 2019-07-15 DIAGNOSIS — Z302 Encounter for sterilization: Secondary | ICD-10-CM

## 2019-07-15 DIAGNOSIS — H538 Other visual disturbances: Secondary | ICD-10-CM | POA: Diagnosis not present

## 2019-07-15 DIAGNOSIS — Z3A37 37 weeks gestation of pregnancy: Secondary | ICD-10-CM | POA: Diagnosis not present

## 2019-07-15 DIAGNOSIS — O1493 Unspecified pre-eclampsia, third trimester: Secondary | ICD-10-CM

## 2019-07-15 DIAGNOSIS — Z20828 Contact with and (suspected) exposure to other viral communicable diseases: Secondary | ICD-10-CM | POA: Diagnosis present

## 2019-07-15 DIAGNOSIS — O34211 Maternal care for low transverse scar from previous cesarean delivery: Secondary | ICD-10-CM | POA: Diagnosis not present

## 2019-07-15 DIAGNOSIS — Z7982 Long term (current) use of aspirin: Secondary | ICD-10-CM | POA: Diagnosis not present

## 2019-07-15 DIAGNOSIS — O99824 Streptococcus B carrier state complicating childbirth: Secondary | ICD-10-CM | POA: Diagnosis present

## 2019-07-15 DIAGNOSIS — O411230 Chorioamnionitis, third trimester, not applicable or unspecified: Secondary | ICD-10-CM | POA: Diagnosis not present

## 2019-07-15 DIAGNOSIS — O1414 Severe pre-eclampsia complicating childbirth: Principal | ICD-10-CM | POA: Diagnosis present

## 2019-07-15 DIAGNOSIS — Z98891 History of uterine scar from previous surgery: Secondary | ICD-10-CM

## 2019-07-15 LAB — SARS CORONAVIRUS 2 BY RT PCR (HOSPITAL ORDER, PERFORMED IN ~~LOC~~ HOSPITAL LAB): SARS Coronavirus 2: NEGATIVE

## 2019-07-15 LAB — CBC
HCT: 36.1 % (ref 36.0–46.0)
Hemoglobin: 12 g/dL (ref 12.0–15.0)
MCH: 28.5 pg (ref 26.0–34.0)
MCHC: 33.2 g/dL (ref 30.0–36.0)
MCV: 85.7 fL (ref 80.0–100.0)
Platelets: 235 10*3/uL (ref 150–400)
RBC: 4.21 MIL/uL (ref 3.87–5.11)
RDW: 12.7 % (ref 11.5–15.5)
WBC: 11 10*3/uL — ABNORMAL HIGH (ref 4.0–10.5)
nRBC: 0 % (ref 0.0–0.2)

## 2019-07-15 LAB — URINALYSIS, ROUTINE W REFLEX MICROSCOPIC
Bilirubin Urine: NEGATIVE
Glucose, UA: NEGATIVE mg/dL
Ketones, ur: NEGATIVE mg/dL
Leukocytes,Ua: NEGATIVE
Nitrite: NEGATIVE
Protein, ur: NEGATIVE mg/dL
Specific Gravity, Urine: 1.019 (ref 1.005–1.030)
pH: 5 (ref 5.0–8.0)

## 2019-07-15 LAB — COMPREHENSIVE METABOLIC PANEL
ALT: 18 U/L (ref 0–44)
AST: 16 U/L (ref 15–41)
Albumin: 2.7 g/dL — ABNORMAL LOW (ref 3.5–5.0)
Alkaline Phosphatase: 132 U/L — ABNORMAL HIGH (ref 38–126)
Anion gap: 11 (ref 5–15)
BUN: 10 mg/dL (ref 6–20)
CO2: 16 mmol/L — ABNORMAL LOW (ref 22–32)
Calcium: 8.8 mg/dL — ABNORMAL LOW (ref 8.9–10.3)
Chloride: 107 mmol/L (ref 98–111)
Creatinine, Ser: 0.67 mg/dL (ref 0.44–1.00)
GFR calc Af Amer: >60 mL/min (ref >60)
GFR calc non Af Amer: >60 mL/min (ref >60)
Glucose, Bld: 83 mg/dL (ref 70–99)
Potassium: 4 mmol/L (ref 3.5–5.1)
Sodium: 134 mmol/L — ABNORMAL LOW (ref 135–145)
Total Bilirubin: 0.6 mg/dL (ref 0.3–1.2)
Total Protein: 6.5 g/dL (ref 6.5–8.1)

## 2019-07-15 LAB — TYPE AND SCREEN
ABO/RH(D): O POS
Antibody Screen: NEGATIVE

## 2019-07-15 LAB — PROTEIN / CREATININE RATIO, URINE
Creatinine, Urine: 161.46 mg/dL
Protein Creatinine Ratio: 0.05 mg/mg{Cre} (ref 0.00–0.15)
Total Protein, Urine: 8 mg/dL

## 2019-07-15 LAB — ABO/RH: ABO/RH(D): O POS

## 2019-07-15 SURGERY — Surgical Case
Anesthesia: Spinal | Wound class: Clean Contaminated

## 2019-07-15 MED ORDER — SODIUM CHLORIDE 0.9 % IV SOLN
INTRAVENOUS | Status: DC | PRN
  Administered 2019-07-15: 19:00:00 via INTRAVENOUS

## 2019-07-15 MED ORDER — NALBUPHINE HCL 10 MG/ML IJ SOLN
5.0000 mg | Freq: Once | INTRAMUSCULAR | Status: DC | PRN
  Filled 2019-07-15: qty 0.5

## 2019-07-15 MED ORDER — LACTATED RINGERS IV SOLN
INTRAVENOUS | Status: DC
  Administered 2019-07-15: 15:00:00 via INTRAVENOUS

## 2019-07-15 MED ORDER — OXYCODONE HCL 5 MG PO TABS: 5.0000 mg | ORAL_TABLET | Freq: Once | ORAL | Status: DC | PRN

## 2019-07-15 MED ORDER — SOD CITRATE-CITRIC ACID 500-334 MG/5ML PO SOLN
30.0000 mL | ORAL | Status: AC
  Administered 2019-07-15: 30 mL via ORAL
  Filled 2019-07-15: qty 30

## 2019-07-15 MED ORDER — SCOPOLAMINE 1 MG/3DAYS TD PT72: 1.0000 | MEDICATED_PATCH | Freq: Once | TRANSDERMAL | Status: DC

## 2019-07-15 MED ORDER — SODIUM CHLORIDE 0.9 % IR SOLN
Status: DC | PRN
  Administered 2019-07-15: 1

## 2019-07-15 MED ORDER — NALBUPHINE HCL 10 MG/ML IJ SOLN
5.0000 mg | INTRAMUSCULAR | Status: DC | PRN
  Filled 2019-07-15: qty 0.5

## 2019-07-15 MED ORDER — NALOXONE HCL 0.4 MG/ML IJ SOLN: 0.4000 mg | INTRAMUSCULAR | Status: DC | PRN

## 2019-07-15 MED ORDER — MAGNESIUM SULFATE BOLUS VIA INFUSION
4.0000 g | Freq: Once | INTRAVENOUS | Status: AC
  Administered 2019-07-15: 4 g via INTRAVENOUS
  Filled 2019-07-15: qty 1000

## 2019-07-15 MED ORDER — MORPHINE SULFATE (PF) 0.5 MG/ML IJ SOLN
INTRAMUSCULAR | Status: DC | PRN
  Administered 2019-07-15: .15 mg via INTRATHECAL

## 2019-07-15 MED ORDER — FENTANYL CITRATE (PF) 100 MCG/2ML IJ SOLN
INTRAMUSCULAR | Status: DC | PRN
  Administered 2019-07-15: 15 ug via INTRATHECAL

## 2019-07-15 MED ORDER — PROMETHAZINE HCL 25 MG/ML IJ SOLN: 6.2500 mg | INTRAMUSCULAR | Status: DC | PRN

## 2019-07-15 MED ORDER — ONDANSETRON HCL 4 MG/2ML IJ SOLN
INTRAMUSCULAR | Status: DC | PRN
  Administered 2019-07-15: 4 mg via INTRAVENOUS

## 2019-07-15 MED ORDER — KETOROLAC TROMETHAMINE 30 MG/ML IJ SOLN
30.0000 mg | Freq: Once | INTRAMUSCULAR | Status: AC | PRN
  Administered 2019-07-15: 30 mg via INTRAVENOUS

## 2019-07-15 MED ORDER — ONDANSETRON HCL 4 MG/2ML IJ SOLN
INTRAMUSCULAR | Status: AC
  Filled 2019-07-15: qty 2

## 2019-07-15 MED ORDER — DIPHENHYDRAMINE HCL 50 MG/ML IJ SOLN: 12.5000 mg | INTRAMUSCULAR | Status: DC | PRN

## 2019-07-15 MED ORDER — NALOXONE HCL 4 MG/10ML IJ SOLN
1.0000 ug/kg/h | INTRAVENOUS | Status: DC | PRN
  Filled 2019-07-15: qty 5

## 2019-07-15 MED ORDER — SOD CITRATE-CITRIC ACID 500-334 MG/5ML PO SOLN: 30.0000 mL | ORAL | Status: DC

## 2019-07-15 MED ORDER — LACTATED RINGERS IV SOLN
INTRAVENOUS | Status: DC | PRN
  Administered 2019-07-15 (×2): via INTRAVENOUS

## 2019-07-15 MED ORDER — DEXTROSE 5 % IV SOLN
3.0000 g | INTRAVENOUS | Status: DC
  Filled 2019-07-15: qty 3000

## 2019-07-15 MED ORDER — MORPHINE SULFATE (PF) 0.5 MG/ML IJ SOLN
INTRAMUSCULAR | Status: AC
  Filled 2019-07-15: qty 10

## 2019-07-15 MED ORDER — OXYTOCIN 40 UNITS IN NORMAL SALINE INFUSION - SIMPLE MED
INTRAVENOUS | Status: AC
  Filled 2019-07-15: qty 1000

## 2019-07-15 MED ORDER — MEPERIDINE HCL 25 MG/ML IJ SOLN: 6.2500 mg | INTRAMUSCULAR | Status: DC | PRN

## 2019-07-15 MED ORDER — PHENYLEPHRINE HCL-NACL 20-0.9 MG/250ML-% IV SOLN
INTRAVENOUS | Status: AC
  Filled 2019-07-15: qty 250

## 2019-07-15 MED ORDER — DEXTROSE 5 % IV SOLN
INTRAVENOUS | Status: DC | PRN
  Administered 2019-07-15: 3 g via INTRAVENOUS

## 2019-07-15 MED ORDER — MAGNESIUM SULFATE 40 GM/1000ML IV SOLN
2.0000 g/h | INTRAVENOUS | Status: DC
  Administered 2019-07-15 (×2): 2 g/h via INTRAVENOUS
  Filled 2019-07-15: qty 1000

## 2019-07-15 MED ORDER — HYDROMORPHONE HCL 1 MG/ML IJ SOLN: 0.2500 mg | INTRAMUSCULAR | Status: DC | PRN

## 2019-07-15 MED ORDER — OXYTOCIN 40 UNITS IN NORMAL SALINE INFUSION - SIMPLE MED
INTRAVENOUS | Status: DC | PRN
  Administered 2019-07-15: 40 [IU] via INTRAVENOUS

## 2019-07-15 MED ORDER — PHENYLEPHRINE HCL (PRESSORS) 10 MG/ML IV SOLN
INTRAVENOUS | Status: DC | PRN
  Administered 2019-07-15 (×2): 80 ug via INTRAVENOUS
  Administered 2019-07-15: 40 ug via INTRAVENOUS

## 2019-07-15 MED ORDER — CEFAZOLIN SODIUM-DEXTROSE 2-4 GM/100ML-% IV SOLN: 2.0000 g | INTRAVENOUS | Status: DC

## 2019-07-15 MED ORDER — DIPHENHYDRAMINE HCL 25 MG PO CAPS: 25.0000 mg | ORAL_CAPSULE | ORAL | Status: DC | PRN

## 2019-07-15 MED ORDER — BUPIVACAINE IN DEXTROSE 0.75-8.25 % IT SOLN
INTRATHECAL | Status: DC | PRN
  Administered 2019-07-15: 1.6 mL via INTRATHECAL

## 2019-07-15 MED ORDER — DEXAMETHASONE SODIUM PHOSPHATE 4 MG/ML IJ SOLN
INTRAMUSCULAR | Status: AC
  Filled 2019-07-15: qty 1

## 2019-07-15 MED ORDER — PHENYLEPHRINE HCL-NACL 20-0.9 MG/250ML-% IV SOLN
INTRAVENOUS | Status: DC | PRN
  Administered 2019-07-15: 80 ug/min via INTRAVENOUS

## 2019-07-15 MED ORDER — PHENYLEPHRINE 40 MCG/ML (10ML) SYRINGE FOR IV PUSH (FOR BLOOD PRESSURE SUPPORT)
PREFILLED_SYRINGE | INTRAVENOUS | Status: AC
  Filled 2019-07-15: qty 10

## 2019-07-15 MED ORDER — OXYCODONE HCL 5 MG/5ML PO SOLN: 5.0000 mg | Freq: Once | ORAL | Status: DC | PRN

## 2019-07-15 MED ORDER — FENTANYL CITRATE (PF) 100 MCG/2ML IJ SOLN
INTRAMUSCULAR | Status: AC
  Filled 2019-07-15: qty 2

## 2019-07-15 MED ORDER — LACTATED RINGERS IV SOLN: INTRAVENOUS | Status: DC

## 2019-07-15 MED ORDER — DEXAMETHASONE SODIUM PHOSPHATE 4 MG/ML IJ SOLN
INTRAMUSCULAR | Status: DC | PRN
  Administered 2019-07-15: 4 mg via INTRAVENOUS

## 2019-07-15 MED ORDER — KETOROLAC TROMETHAMINE 30 MG/ML IJ SOLN
INTRAMUSCULAR | Status: AC
  Filled 2019-07-15: qty 1

## 2019-07-15 MED ORDER — ONDANSETRON HCL 4 MG/2ML IJ SOLN
4.0000 mg | Freq: Three times a day (TID) | INTRAMUSCULAR | Status: DC | PRN
  Administered 2019-07-17: 4 mg via INTRAVENOUS
  Filled 2019-07-15: qty 2

## 2019-07-15 MED ORDER — SODIUM CHLORIDE 0.9% FLUSH: 3.0000 mL | INTRAVENOUS | Status: DC | PRN

## 2019-07-15 SURGICAL SUPPLY — 36 items
BENZOIN TINCTURE PRP APPL 2/3 (GAUZE/BANDAGES/DRESSINGS) ×2 IMPLANT
CLAMP CORD UMBIL (MISCELLANEOUS) IMPLANT
CLOSURE WOUND 1/2 X4 (GAUZE/BANDAGES/DRESSINGS) ×1
CLOTH BEACON ORANGE TIMEOUT ST (SAFETY) ×3 IMPLANT
DERMABOND ADVANCED (GAUZE/BANDAGES/DRESSINGS)
DERMABOND ADVANCED .7 DNX12 (GAUZE/BANDAGES/DRESSINGS) IMPLANT
DRSG OPSITE POSTOP 4X10 (GAUZE/BANDAGES/DRESSINGS) ×3 IMPLANT
ELECT REM PT RETURN 9FT ADLT (ELECTROSURGICAL) ×3
ELECTRODE REM PT RTRN 9FT ADLT (ELECTROSURGICAL) ×1 IMPLANT
EXTRACTOR VACUUM M CUP 4 TUBE (SUCTIONS) IMPLANT
EXTRACTOR VACUUM M CUP 4' TUBE (SUCTIONS)
GLOVE BIO SURGEON STRL SZ7.5 (GLOVE) ×3 IMPLANT
GLOVE BIOGEL PI IND STRL 7.0 (GLOVE) ×1 IMPLANT
GLOVE BIOGEL PI INDICATOR 7.0 (GLOVE) ×2
GOWN STRL REUS W/TWL LRG LVL3 (GOWN DISPOSABLE) ×6 IMPLANT
KIT ABG SYR 3ML LUER SLIP (SYRINGE) ×3 IMPLANT
NDL HYPO 25X5/8 SAFETYGLIDE (NEEDLE) ×1 IMPLANT
NEEDLE HYPO 22GX1.5 SAFETY (NEEDLE) ×3 IMPLANT
NEEDLE HYPO 25X5/8 SAFETYGLIDE (NEEDLE) ×3 IMPLANT
NS IRRIG 1000ML POUR BTL (IV SOLUTION) ×3 IMPLANT
PACK C SECTION WH (CUSTOM PROCEDURE TRAY) ×3 IMPLANT
PAD ABD 8X10 STRL (GAUZE/BANDAGES/DRESSINGS) ×2 IMPLANT
PAD OB MATERNITY 4.3X12.25 (PERSONAL CARE ITEMS) ×3 IMPLANT
PENCIL SMOKE EVAC W/HOLSTER (ELECTROSURGICAL) ×3 IMPLANT
STRIP CLOSURE SKIN 1/2X4 (GAUZE/BANDAGES/DRESSINGS) ×1 IMPLANT
SUT MNCRL 0 VIOLET CTX 36 (SUTURE) ×4 IMPLANT
SUT MONOCRYL 0 CTX 36 (SUTURE) ×8
SUT PDS AB 0 CTX 60 (SUTURE) ×3 IMPLANT
SUT PLAIN 0 NONE (SUTURE) ×2 IMPLANT
SUT PLAIN 2 0 (SUTURE)
SUT PLAIN 2 0 XLH (SUTURE) ×3 IMPLANT
SUT PLAIN ABS 2-0 CT1 27XMFL (SUTURE) IMPLANT
SUT VIC AB 4-0 KS 27 (SUTURE) ×3 IMPLANT
TOWEL OR 17X24 6PK STRL BLUE (TOWEL DISPOSABLE) ×3 IMPLANT
TRAY FOLEY W/BAG SLVR 14FR LF (SET/KITS/TRAYS/PACK) ×3 IMPLANT
WATER STERILE IRR 1000ML POUR (IV SOLUTION) ×3 IMPLANT

## 2019-07-15 NOTE — Op Note (Signed)
NAMETRULY, STANKIEWICZ MEDICAL RECORD [id number] ACCOUNT [id number] DATE OF BIRTH:1991/04/08 FACILITY: [facility] LOCATION: [facility] PHYSICIAN:Jdyn Parkerson E. Dayanira Giovannetti II, MD  OPERATIVE REPORT  DATE OF PROCEDURE:  07/15/2019  PREOPERATIVE DIAGNOSES: 1.  Preeclampsia with severe features. 2.  Desires repeat cesarean section. 3.  Desires permanent sterilization.  POSTOPERATIVE DIAGNOSES:   1.  Preeclampsia with severe features. 2.  Desires repeat cesarean section.  3.   Desires permanent sterilization.  PROCEDURES: 1.  Low transverse cesarean section. 2.  Bilateral tubal ligation.  SURGEON:  Everlene Farrier, MD   ANESTHESIA:  Spinal.  SPECIMENS:   1.  Placenta to pathology. 2.  Bilateral fallopian tube segments to pathology.  FINDINGS:  Viable female infant.  Apgars, arterial cord pH, birth weight pending.  INDICATIONS AND CONSENT:  This patient is a 28 year old G3 P1 at 37-4/7 weeks.  She has been on aspirin 81 mg daily this pregnancy due to a history of severe preeclampsia with her first pregnancy.  She also had a cesarean section at that time because of  failed induction.  She desires repeat cesarean section and bilateral tubal ligation.  Blood pressures have been in the 130s/80s throughout pregnancy and this morning, she presents with blurry vision and mild headache.  She has elevated blood pressure in  MAU.  Labs were normal.  A diagnosis of preeclampsia with severe features was made and recommendation for delivery was made.  Repeat cesarean section was discussed preoperatively and risks were reviewed, including but not limited to infection, organ  damage, bleeding requiring transfusion of blood products with HIV and hepatitis acquisition, DVT, PE, pneumonia, return to the operating room and wound breakdown.  Bilateral tubal ligation was also discussed including permanence, failure rate and  increased ectopic risk.  The patient states she understands and agrees and consent was signed on  the chart.  She was started on magnesium sulfate prior to transfer to the operating room.  DESCRIPTION OF PROCEDURE:  The patient was taken to the operating room where she was identified.  Spinal anesthetic was placed per anesthesiology and she was placed in the dorsal supine position with a 15-degree left lateral wedge.  She was then prepped  vaginally.  Foley catheter was placed in the bladder and she was prepped abdominally with ChloraPrep.  Timeout was undertaken.  After 3 minute drying time, she was draped in a sterile fashion.  After testing for adequate spinal anesthesia, skin was  entered through the Pfannenstiel scar and dissection was carried out in layers to the peritoneum.  Peritoneum was taken down superiorly and inferiorly.  Vesicouterine peritoneum was taken down cephalolaterally, bladder flap developed and the bladder  blade was placed.  Uterus was incised in a low transverse manner.  The uterine cavity was entered bluntly with a hemostat.  Uterine incision was extended bilaterally with fingers.  Clear fluid was noted.  Baby was delivered from the vertex position  without difficulty.  Good cry and tone is noted.  After 1 minute waiting time, the cord was clamped and cut and the baby was handed to waiting pediatrics team.  Placenta was manually delivered and sent to pathology.  Uterine cavity was cleaned and the  uterus was closed in 2 running locking imbricating layers of 0 Monocryl suture, which achieved good hemostasis.  Fallopian tubes and ovaries are normal bilaterally.  Right fallopian tube was identified from cornu to fimbria, grasped in its mid ampullary  portion with Babcock clamp and a knuckle of tube was doubly ligated with 2 free  ties of plain suture.  The intervening knuckle was then sharply resected.  Cautery was used to assure hemostasis.  Similar procedure was carried out on the left side.  Lavage  was carried out and all returned as clear.  Anterior peritoneum was closed in a  running fashion with 0 Monocryl suture, which was also used to reapproximate the pyramidalis muscle in the midline.  Anterior rectus fascia was closed in a running fashion  with a 0 looped PDS.  Subcutaneous layer was closed with plain suture and the skin was closed in a subcuticular fashion with the 4-0 Vicryl on a Keith needle.  Benzoin, Steri-Strips and a honeycomb dressing was applied.  Pressure dressing was applied  over that.  All counts were correct and the patient was taken to the recovery room in stable condition.  VN/NUANCE  D:07/15/2019 T:07/15/2019 JOB:[id number]

## 2019-07-15 NOTE — Anesthesia Postprocedure Evaluation (Signed)
Anesthesia Post Note  Patient: Regina Higgins  Procedure(s) Performed: CESAREAN SECTION WITH BILATERAL TUBAL LIGATION (N/A )     Patient location during evaluation: PACU Anesthesia Type: Spinal Level of consciousness: oriented and awake and alert Pain management: pain level controlled Vital Signs Assessment: post-procedure vital signs reviewed and stable Respiratory status: spontaneous breathing and respiratory function stable Cardiovascular status: blood pressure returned to baseline and stable Postop Assessment: no headache, no backache and no apparent nausea or vomiting Anesthetic complications: no    Last Vitals:  Vitals:   07/15/19 2045 07/15/19 2100  BP: 136/83 (!) 143/73  Pulse: 96 (!) 102  Resp: 15 (!) 22  Temp:  36.8 C  SpO2: 97% 97%    Last Pain:  Vitals:   07/15/19 2100  TempSrc:   PainSc: 0-No pain   Pain Goal:                Epidural/Spinal Function Cutaneous sensation: Tingles (07/15/19 2100), Patient able to flex knees: Yes (07/15/19 2100), Patient able to lift hips off bed: Yes (07/15/19 2100), Back pain beyond tenderness at insertion site: No (07/15/19 2100), Progressively worsening motor and/or sensory loss: No (07/15/19 2100), Bowel and/or bladder incontinence post epidural: No (07/15/19 2100)  Lynda Rainwater

## 2019-07-15 NOTE — MAU Note (Addendum)
Pt states she noticed new onset blurred vision this am & checked BP at home it was 140/90.  She is also noticing spots since she has been here.  She has gHTN with last pregnancy; is taking baby ASA; was put on Labetalol prior to pregnancy, but it was d/c'd by MD early pregnancy. Pt c/o of "slight h/a" at this time as 2 on 0-10 pain scale.  DTRs +2, 2+ pitting edema noted BLE; no clonus.

## 2019-07-15 NOTE — MAU Provider Note (Signed)
Patient Regina Higgins is a 28 y.o. [phone] At [redacted]w[redacted]d here complaints of on-going blurry vision and floating spots this morning. She denies LOF, vaginal bleeding, decreased fetal movements, no pain with urination, no other ob-gyn complaints.   She is a questionable chronic hypertension; reports that she was put on labetalol by her primary doctor when she was around 3-[redacted] weeks pregnant, when her blood pressure was elevated. She was  Put 100 mg of labetelol BID at that time, then discontinued by Dr. Langston Masker at 8-9 weeks.   Her blood pressure has been normal in this pregnancy until 34-35 weeks, now her BPs have been in 140/90, 130-80s.  At home her BP is usually 140-90s.   She is not on any blood pressure medicine ight now.  She has a repeat scheduled C-section for 11/13.   Patient last ate at 8 am.  History     CSN: [id number]  Arrival date and time: 07/15/19 1218   First Provider Initiated Contact with Patient 07/15/19 1339      Chief Complaint  Patient presents with  . Hypertension   Hypertension This is a chronic problem. The problem is unchanged. Associated symptoms include blurred vision and headaches. Pertinent negatives include no palpitations or shortness of breath. (Also floating spots)  She is unable to read street signs or read signs across the room.    OB History    Gravida  3   Para  1   Term      Preterm  1   AB  1   Living  1     SAB      TAB  1   Ectopic      Multiple  0   Live Births  1           Past Medical History:  Diagnosis Date  . Anemia   . Chicken pox   . History of gestational hypertension   . Hypertension   . Migraines     Past Surgical History:  Procedure Laterality Date  . breast lump removed  08/2008   Rt breast  . CESAREAN SECTION N/A 10/16/2016   Procedure: CESAREAN SECTION;  Surgeon: Richarda Overlie, MD;  Location: [facility];  Service: Obstetrics;  Laterality: N/A;    Family History  Problem Relation Age  of Onset  . Hypertension Mother   . Diabetes Mother   . Heart disease Maternal Grandmother   . Heart disease Maternal Grandfather     Social History   Tobacco Use  . Smoking status: Never Smoker  . Smokeless tobacco: Never Used  Substance Use Topics  . Alcohol use: No  . Drug use: No    Allergies: No Known Allergies  Medications Prior to Admission  Medication Sig Dispense Refill Last Dose  . aspirin 81 MG chewable tablet Chew by mouth daily.     Marland Kitchen labetalol (NORMODYNE) 100 MG tablet Take 1 tablet (100 mg total) by mouth 2 (two) times daily. (Patient not taking: Reported on 07/15/2019) 60 tablet 2   . Prenatal Vit-Fe Fumarate-FA (MULTIVITAMIN-PRENATAL) 27-0.8 MG TABS tablet Take 1 tablet by mouth daily at 12 noon.       Review of Systems  HENT: Negative.   Eyes: Positive for blurred vision and visual disturbance.       Floating spots and blurry vision.   Respiratory: Negative.  Negative for shortness of breath.   Cardiovascular: Negative.  Negative for palpitations.  Gastrointestinal: Negative.   Genitourinary: Negative.   Allergic/Immunologic:  Negative.   Neurological: Positive for headaches.   Physical Exam   Blood pressure (!) 146/68, pulse (!) 111, temperature 98.7 F (37.1 C), temperature source Oral, resp. rate 16, SpO2 98 %, unknown if currently breastfeeding.  Physical Exam  Constitutional: She is oriented to person, place, and time. She appears well-developed.  Eyes: Pupils are equal, round, and reactive to light.  Neck: Normal range of motion.  Neurological: She is alert and oriented to person, place, and time.  Skin: Skin is warm and dry.    MAU Course  Procedures  MDM -NST: 135 bpm, mod var, present acel, neg decels, no contractions.  -CBC, CMP, PCR, RPR, other admission labs drawn, will start IV and initiate pre-e protocol/MgS04 due to patients severe features -Dr. Gaetano Net notified; he will call OR and place admission orders Assessment and Plan   -patient for repeat C/section.   Mervyn Skeeters Bladen Umar 07/15/2019, 1:42 PM

## 2019-07-15 NOTE — Anesthesia Preprocedure Evaluation (Addendum)
Anesthesia Evaluation  Patient identified by MRN, date of birth, ID band Patient awake  General Assessment Comment:Full meal at 8:30am  Reviewed: Allergy & Precautions, H&P , NPO status , Patient's Chart, lab work & pertinent test results, reviewed documented beta blocker date and time   Airway Mallampati: II  TM Distance: >3 FB Neck ROM: full    Dental no notable dental hx.    Pulmonary neg pulmonary ROS,    Pulmonary exam normal breath sounds clear to auscultation       Cardiovascular hypertension, Pt. on medications and Pt. on home beta blockers Normal cardiovascular exam Rhythm:Regular Rate:Normal     Neuro/Psych  Headaches, negative psych ROS   GI/Hepatic negative GI ROS, Neg liver ROS,   Endo/Other  Morbid obesityBMI 44  Renal/GU negative Renal ROS     Musculoskeletal   Abdominal (+) + obese,   Peds  Hematology  (+) anemia , hct 36, plt 225   Anesthesia Other Findings   Reproductive/Obstetrics (+) Pregnancy 1 prior c section PreE with SF, vision changes                          Anesthesia Physical  Anesthesia Plan  ASA: III  Anesthesia Plan: Spinal   Post-op Pain Management:    Induction:   PONV Risk Score and Plan: 2 and Ondansetron, Treatment may vary due to age or medical condition and Midazolam  Airway Management Planned: Natural Airway  Additional Equipment: None  Intra-op Plan:   Post-operative Plan:   Informed Consent: I have reviewed the patients History and Physical, chart, labs and discussed the procedure including the risks, benefits and alternatives for the proposed anesthesia with the patient or authorized representative who has indicated his/her understanding and acceptance.       Plan Discussed with: CRNA  Anesthesia Plan Comments:       Anesthesia Quick Evaluation

## 2019-07-15 NOTE — H&P (Signed)
Regina Higgins is a 28 y.o. female presenting for blurry vision since about 8:30am today. Now has some floaters at periphery of visual fields. No severe HA, no epigastric pain. Last pregnancy she had preeclampsia with severe features, failed 3 day IOL and a C/S. She is scheduled for a 38 week repeat C/S with BTL. No vaginal bleeding, no ROM. Taking 81mg  ASA this pregnancy. BPs have been 130s/80s throughout pregnancy. OB History    Gravida  3   Para  1   Term      Preterm  1   AB  1   Living  1     SAB      TAB  1   Ectopic      Multiple  0   Live Births  1          Past Medical History:  Diagnosis Date  . Anemia   . Chicken pox   . History of gestational hypertension   . Hypertension   . Migraines    Past Surgical History:  Procedure Laterality Date  . breast lump removed  08/2008   Rt breast  . CESAREAN SECTION N/A 10/16/2016   Procedure: CESAREAN SECTION;  Surgeon: Molli Posey, MD;  Location: [facility];  Service: Obstetrics;  Laterality: N/A;   Family History: family history includes Diabetes in her mother; Heart disease in her maternal grandfather and maternal grandmother; Hypertension in her mother. Social History:  reports that she has never smoked. She has never used smokeless tobacco. She reports that she does not drink alcohol or use drugs.     Maternal Diabetes: No Genetic Screening: Normal Maternal Ultrasounds/Referrals: Normal Fetal Ultrasounds or other Referrals:  None Maternal Substance Abuse:  No Significant Maternal Medications:  Meds include: Other: ASA 81mg  Significant Maternal Lab Results:  Group B Strep positive Other Comments:  None  Review of Systems  Eyes: Positive for blurred vision.  Gastrointestinal: Negative for abdominal pain.  Neurological: Negative for headaches.   Maternal Medical History:  Fetal activity: Perceived fetal activity is normal.        Blood pressure 138/83, pulse (!) 118, temperature 98.8 F  (37.1 C), temperature source Oral, resp. rate 18, SpO2 98 %, unknown if currently breastfeeding. Maternal Exam:  Abdomen: Fetal presentation: vertex     Fetal Exam Fetal State Assessment: Category I - tracings are normal.     Physical Exam  Cardiovascular: Normal rate and regular rhythm.  Respiratory: Effort normal and breath sounds normal.  GI: Soft. There is no abdominal tenderness.  Neurological: She has normal reflexes.    Prenatal labs: ABO, Rh: O/Positive/-- (04/14 0000) Antibody: Negative (04/14 0000) Rubella: Immune (04/14 0000) RPR: Nonreactive (04/14 0000)  HBsAg: Negative (04/14 0000)  HIV: Non-reactive (04/14 0000)  GBS:   postive  Assessment/Plan: 28 yo G3P1 @ 37 4/7 wks with preeclampsia with severe features. Recommend delivery. D/W repeat C/S and BTL and risks including infection, organ damage, bleeding/transfusion-HIV/Hep, DVT/PE, pneumonia, wound separation and return to OR. Also D/W BTL including permanence, failure rate and increased ectopic risk. She states she understands and agrees.  Labs just drawn>panding. Magnesium sulfate bolus in. D/W PP magnesium infusion.    Shon Millet II 07/15/2019, 3:15 PM

## 2019-07-15 NOTE — Brief Op Note (Signed)
07/15/2019  7:59 PM  PATIENT:  Regina Higgins  28 y.o. female  PRE-OPERATIVE DIAGNOSIS:  Repeat cesarean section with Bilateral Tubal ligation, add on Pre E, visual changes  POST-OPERATIVE DIAGNOSIS:  Repeat cesarean section with Bilateral Tubal ligation, add on Pre E, visual changes  PROCEDURE:  Procedure(s): CESAREAN SECTION WITH BILATERAL TUBAL LIGATION (N/A)  SURGEON:  Surgeon(s) and Role:    * Everlene Farrier, MD - Primary  PHYSICIAN ASSISTANT:   ASSISTANTS: none   ANESTHESIA:   spinal  EBL:  100 mL   BLOOD ADMINISTERED:none  DRAINS: Urinary Catheter (Foley)   LOCAL MEDICATIONS USED:  NONE  SPECIMEN:  Source of Specimen:  Placenta  DISPOSITION OF SPECIMEN:  PATHOLOGY  COUNTS:  YES  TOURNIQUET:  * No tourniquets in log *  DICTATION: .Other Dictation: Dictation Number [id number]  PLAN OF CARE: Admit to inpatient   PATIENT DISPOSITION:  PACU - hemodynamically stable.   Delay start of Pharmacological VTE agent (>24hrs) due to surgical blood loss or risk of bleeding: not applicable

## 2019-07-15 NOTE — Progress Notes (Signed)
Dr Gaetano Net updated on status of COVID test.

## 2019-07-15 NOTE — Progress Notes (Signed)
Dr Ihor Dow calling main lab to give orders to run COVID test stat.

## 2019-07-15 NOTE — Transfer of Care (Signed)
Immediate Anesthesia Transfer of Care Note  Patient: Trany Chernick  Procedure(s) Performed: CESAREAN SECTION WITH BILATERAL TUBAL LIGATION (N/A )  Patient Location: PACU  Anesthesia Type:Spinal  Level of Consciousness: awake, alert  and patient cooperative  Airway & Oxygen Therapy: Patient Spontanous Breathing  Post-op Assessment: Report given to RN and Post -op Vital signs reviewed and stable  Post vital signs: Reviewed and stable  Last Vitals:  Vitals Value Taken Time  BP 129/71 07/15/19 2006  Temp    Pulse 96 07/15/19 2007  Resp 19 07/15/19 2007  SpO2 98 % 07/15/19 2007  Vitals shown include unvalidated device data.  Last Pain:  Vitals:   07/15/19 1602  TempSrc: Oral  PainSc:          Complications: No apparent anesthesia complications

## 2019-07-15 NOTE — Anesthesia Procedure Notes (Signed)
Spinal  Patient location during procedure: OB Start time: 07/15/2019 6:40 PM End time: 07/15/2019 6:45 PM Staffing Anesthesiologist: Lynda Rainwater, MD Performed: anesthesiologist  Preanesthetic Checklist Completed: patient identified, surgical consent, pre-op evaluation, timeout performed, IV checked, risks and benefits discussed and monitors and equipment checked Spinal Block Patient position: sitting Prep: site prepped and draped and DuraPrep Patient monitoring: heart rate, cardiac monitor, continuous pulse ox and blood pressure Approach: midline Location: L3-4 Injection technique: single-shot Needle Needle type: Pencan  Needle gauge: 24 G Needle length: 10 cm Assessment Sensory level: T4

## 2019-07-15 NOTE — Progress Notes (Signed)
Dr Ihor Dow called this RN to update that rapid COVID test is now being run.  Dr Gaetano Net call & updated. No new orders at this time.

## 2019-07-16 ENCOUNTER — Encounter (HOSPITAL_COMMUNITY): Payer: Self-pay | Admitting: Obstetrics and Gynecology

## 2019-07-16 LAB — CBC
HCT: 32.4 % — ABNORMAL LOW (ref 36.0–46.0)
Hemoglobin: 10.8 g/dL — ABNORMAL LOW (ref 12.0–15.0)
MCH: 28.7 pg (ref 26.0–34.0)
MCHC: 33.3 g/dL (ref 30.0–36.0)
MCV: 86.2 fL (ref 80.0–100.0)
Platelets: 244 10*3/uL (ref 150–400)
RBC: 3.76 MIL/uL — ABNORMAL LOW (ref 3.87–5.11)
RDW: 12.8 % (ref 11.5–15.5)
WBC: 11 10*3/uL — ABNORMAL HIGH (ref 4.0–10.5)
nRBC: 0 % (ref 0.0–0.2)

## 2019-07-16 LAB — COMPREHENSIVE METABOLIC PANEL
ALT: 16 U/L (ref 0–44)
AST: 18 U/L (ref 15–41)
Albumin: 2.3 g/dL — ABNORMAL LOW (ref 3.5–5.0)
Alkaline Phosphatase: 107 U/L (ref 38–126)
Anion gap: 7 (ref 5–15)
BUN: 5 mg/dL — ABNORMAL LOW (ref 6–20)
CO2: 20 mmol/L — ABNORMAL LOW (ref 22–32)
Calcium: 7.7 mg/dL — ABNORMAL LOW (ref 8.9–10.3)
Chloride: 107 mmol/L (ref 98–111)
Creatinine, Ser: 0.62 mg/dL (ref 0.44–1.00)
GFR calc Af Amer: >60 mL/min (ref >60)
GFR calc non Af Amer: >60 mL/min (ref >60)
Glucose, Bld: 119 mg/dL — ABNORMAL HIGH (ref 70–99)
Potassium: 4.2 mmol/L (ref 3.5–5.1)
Sodium: 134 mmol/L — ABNORMAL LOW (ref 135–145)
Total Bilirubin: 0.4 mg/dL (ref 0.3–1.2)
Total Protein: 5.5 g/dL — ABNORMAL LOW (ref 6.5–8.1)

## 2019-07-16 LAB — MAGNESIUM: Magnesium: 4.7 mg/dL — ABNORMAL HIGH (ref 1.7–2.4)

## 2019-07-16 LAB — RPR: RPR Ser Ql: NONREACTIVE

## 2019-07-16 MED ORDER — MENTHOL 3 MG MT LOZG: 1.0000 | LOZENGE | OROMUCOSAL | Status: DC | PRN

## 2019-07-16 MED ORDER — WITCH HAZEL-GLYCERIN EX PADS: 1.0000 "application " | MEDICATED_PAD | CUTANEOUS | Status: DC | PRN

## 2019-07-16 MED ORDER — HYDROMORPHONE HCL 1 MG/ML IJ SOLN: 0.2000 mg | INTRAMUSCULAR | Status: DC | PRN

## 2019-07-16 MED ORDER — LABETALOL HCL 100 MG PO TABS
100.0000 mg | ORAL_TABLET | Freq: Two times a day (BID) | ORAL | Status: DC
  Administered 2019-07-16 – 2019-07-18 (×5): 100 mg via ORAL
  Filled 2019-07-16 (×5): qty 1

## 2019-07-16 MED ORDER — TETANUS-DIPHTH-ACELL PERTUSSIS 5-2.5-18.5 LF-MCG/0.5 IM SUSP: 0.5000 mL | Freq: Once | INTRAMUSCULAR | Status: DC

## 2019-07-16 MED ORDER — PRENATAL MULTIVITAMIN CH
1.0000 | ORAL_TABLET | Freq: Every day | ORAL | Status: DC
  Administered 2019-07-16 – 2019-07-17 (×2): 1 via ORAL
  Filled 2019-07-16 (×2): qty 1

## 2019-07-16 MED ORDER — MAGNESIUM SULFATE 40 GM/1000ML IV SOLN
2.0000 g/h | INTRAVENOUS | Status: AC
  Administered 2019-07-16: 2 g/h via INTRAVENOUS
  Filled 2019-07-16: qty 1000

## 2019-07-16 MED ORDER — ACETAMINOPHEN 325 MG PO TABS
650.0000 mg | ORAL_TABLET | ORAL | Status: DC | PRN
  Administered 2019-07-16 – 2019-07-18 (×6): 650 mg via ORAL
  Filled 2019-07-16 (×8): qty 2

## 2019-07-16 MED ORDER — SIMETHICONE 80 MG PO CHEW
80.0000 mg | CHEWABLE_TABLET | ORAL | Status: DC
  Administered 2019-07-16 – 2019-07-18 (×3): 80 mg via ORAL
  Filled 2019-07-16 (×3): qty 1

## 2019-07-16 MED ORDER — ZOLPIDEM TARTRATE 5 MG PO TABS: 5.0000 mg | ORAL_TABLET | Freq: Every evening | ORAL | Status: DC | PRN

## 2019-07-16 MED ORDER — OXYCODONE HCL 5 MG PO TABS
5.0000 mg | ORAL_TABLET | ORAL | Status: DC | PRN
  Administered 2019-07-16 – 2019-07-17 (×5): 5 mg via ORAL
  Administered 2019-07-17 – 2019-07-18 (×7): 10 mg via ORAL
  Filled 2019-07-16: qty 1
  Filled 2019-07-16: qty 2
  Filled 2019-07-16: qty 1
  Filled 2019-07-16: qty 2
  Filled 2019-07-16: qty 1
  Filled 2019-07-16 (×6): qty 2
  Filled 2019-07-16: qty 1

## 2019-07-16 MED ORDER — OXYTOCIN 40 UNITS IN NORMAL SALINE INFUSION - SIMPLE MED: 2.5000 [IU]/h | INTRAVENOUS | Status: AC

## 2019-07-16 MED ORDER — SIMETHICONE 80 MG PO CHEW: 80.0000 mg | CHEWABLE_TABLET | ORAL | Status: DC | PRN

## 2019-07-16 MED ORDER — LACTATED RINGERS IV SOLN
INTRAVENOUS | Status: DC
  Administered 2019-07-16: 18:00:00 via INTRAVENOUS

## 2019-07-16 MED ORDER — COCONUT OIL OIL: 1.0000 "application " | TOPICAL_OIL | Status: DC | PRN

## 2019-07-16 MED ORDER — SIMETHICONE 80 MG PO CHEW
80.0000 mg | CHEWABLE_TABLET | Freq: Three times a day (TID) | ORAL | Status: DC
  Administered 2019-07-16 – 2019-07-18 (×7): 80 mg via ORAL
  Filled 2019-07-16 (×7): qty 1

## 2019-07-16 MED ORDER — DIPHENHYDRAMINE HCL 25 MG PO CAPS: 25.0000 mg | ORAL_CAPSULE | Freq: Four times a day (QID) | ORAL | Status: DC | PRN

## 2019-07-16 MED ORDER — SENNOSIDES-DOCUSATE SODIUM 8.6-50 MG PO TABS
2.0000 | ORAL_TABLET | ORAL | Status: DC
  Administered 2019-07-16 – 2019-07-18 (×3): 2 via ORAL
  Filled 2019-07-16 (×3): qty 2

## 2019-07-16 MED ORDER — DIBUCAINE (PERIANAL) 1 % EX OINT: 1.0000 "application " | TOPICAL_OINTMENT | CUTANEOUS | Status: DC | PRN

## 2019-07-16 NOTE — Progress Notes (Signed)
POD # 1  C Section/Severe Preeclampsia  S:  Patient doing well. Eating breakfast.  Feeling much better.  O:  BP 136/63 (BP Location: Left Arm)   Pulse 91   Temp 98 F (36.7 C) (Oral)   Resp 20   Ht 5\' 5"  (1.651 m)   Wt 122 kg Comment: weight from office visit on 11/2  SpO2 96%   Breastfeeding Unknown   BMI 44.76 kg/m  Results for orders placed or performed during the hospital encounter of 07/15/19 (from the past 24 hour(s))  Urinalysis, Routine w reflex microscopic     Status: Abnormal   Collection Time: 07/15/19 12:46 PM  Result Value Ref Range   Color, Urine YELLOW YELLOW   APPearance HAZY (A) CLEAR   Specific Gravity, Urine 1.019 1.005 - 1.030   pH 5.0 5.0 - 8.0   Glucose, UA NEGATIVE NEGATIVE mg/dL   Hgb urine dipstick SMALL (A) NEGATIVE   Bilirubin Urine NEGATIVE NEGATIVE   Ketones, ur NEGATIVE NEGATIVE mg/dL   Protein, ur NEGATIVE NEGATIVE mg/dL   Nitrite NEGATIVE NEGATIVE   Leukocytes,Ua NEGATIVE NEGATIVE   RBC / HPF 0-5 0 - 5 RBC/hpf   WBC, UA 0-5 0 - 5 WBC/hpf   Bacteria, UA RARE (A) NONE SEEN   Squamous Epithelial / LPF 0-5 0 - 5   Mucus PRESENT   Protein / creatinine ratio, urine     Status: None   Collection Time: 07/15/19  1:31 PM  Result Value Ref Range   Creatinine, Urine 161.46 mg/dL   Total Protein, Urine 8 mg/dL   Protein Creatinine Ratio 0.05 0.00 - 0.15 mg/mg[Cre]  SARS Coronavirus 2 by RT PCR (hospital order, performed in [facility] hospital lab) Nasopharyngeal Nasopharyngeal Swab     Status: None   Collection Time: 07/15/19  2:06 PM   Specimen: Nasopharyngeal Swab  Result Value Ref Range   SARS Coronavirus 2 NEGATIVE NEGATIVE  Comprehensive metabolic panel     Status: Abnormal   Collection Time: 07/15/19  3:04 PM  Result Value Ref Range   Sodium 134 (L) 135 - 145 mmol/L   Potassium 4.0 3.5 - 5.1 mmol/L   Chloride 107 98 - 111 mmol/L   CO2 16 (L) 22 - 32 mmol/L   Glucose, Bld 83 70 - 99 mg/dL   BUN 10 6 - 20 mg/dL   Creatinine, Ser 13/05/20  0.44 - 1.00 mg/dL   Calcium 8.8 (L) 8.9 - 10.3 mg/dL   Total Protein 6.5 6.5 - 8.1 g/dL   Albumin 2.7 (L) 3.5 - 5.0 g/dL   AST 16 15 - 41 U/L   ALT 18 0 - 44 U/L   Alkaline Phosphatase 132 (H) 38 - 126 U/L   Total Bilirubin 0.6 0.3 - 1.2 mg/dL   GFR calc non Af Amer >60 >60 mL/min   GFR calc Af Amer >60 >60 mL/min   Anion gap 11 5 - 15  CBC     Status: Abnormal   Collection Time: 07/15/19  3:04 PM  Result Value Ref Range   WBC 11.0 (H) 4.0 - 10.5 K/uL   RBC 4.21 3.87 - 5.11 MIL/uL   Hemoglobin 12.0 12.0 - 15.0 g/dL   HCT 13/05/20 34.7 - 42.5 %   MCV 85.7 80.0 - 100.0 fL   MCH 28.5 26.0 - 34.0 pg   MCHC 33.2 30.0 - 36.0 g/dL   RDW 95.6 38.7 - 56.4 %   Platelets 235 150 - 400 K/uL   nRBC 0.0  0.0 - 0.2 %  Type and screen Box     Status: None   Collection Time: 07/15/19  3:05 PM  Result Value Ref Range   ABO/RH(D) O POS    Antibody Screen NEG    Sample Expiration      07/18/2019,2359 Performed at [facility], [place] [address], [place], [place] [address]   ABO/Rh     Status: None   Collection Time: 07/15/19  3:05 PM  Result Value Ref Range   ABO/RH(D)      O POS Performed at [facility] [address], [place], [place] [address]   CBC     Status: Abnormal   Collection Time: 07/16/19  5:25 AM  Result Value Ref Range   WBC 11.0 (H) 4.0 - 10.5 K/uL   RBC 3.76 (L) 3.87 - 5.11 MIL/uL   Hemoglobin 10.8 (L) 12.0 - 15.0 g/dL   HCT 32.4 (L) 36.0 - 46.0 %   MCV 86.2 80.0 - 100.0 fL   MCH 28.7 26.0 - 34.0 pg   MCHC 33.3 30.0 - 36.0 g/dL   RDW 12.8 11.5 - 15.5 %   Platelets 244 150 - 400 K/uL   nRBC 0.0 0.0 - 0.2 %  Comprehensive metabolic panel     Status: Abnormal   Collection Time: 07/16/19  5:25 AM  Result Value Ref Range   Sodium 134 (L) 135 - 145 mmol/L   Potassium 4.2 3.5 - 5.1 mmol/L   Chloride 107 98 - 111 mmol/L   CO2 20 (L) 22 - 32 mmol/L   Glucose, Bld 119 (H) 70 - 99 mg/dL   BUN 5 (L) 6 - 20 mg/dL   Creatinine, Ser 0.62 0.44  - 1.00 mg/dL   Calcium 7.7 (L) 8.9 - 10.3 mg/dL   Total Protein 5.5 (L) 6.5 - 8.1 g/dL   Albumin 2.3 (L) 3.5 - 5.0 g/dL   AST 18 15 - 41 U/L   ALT 16 0 - 44 U/L   Alkaline Phosphatase 107 38 - 126 U/L   Total Bilirubin 0.4 0.3 - 1.2 mg/dL   GFR calc non Af Amer >60 >60 mL/min   GFR calc Af Amer >60 >60 mL/min   Anion gap 7 5 - 15  Magnesium     Status: Abnormal   Collection Time: 07/16/19  5:25 AM  Result Value Ref Range   Magnesium 4.7 (H) 1.7 - 2.4 mg/dL   Scheduled Meds: . labetalol  100 mg Oral BID  . prenatal multivitamin  1 tablet Oral Q1200  . scopolamine  1 patch Transdermal Once  . senna-docusate  2 tablet Oral Q24H  . simethicone  80 mg Oral TID PC  . simethicone  80 mg Oral Q24H   Continuous Infusions: . lactated ringers 75 mL/hr at 07/16/19 0724  . magnesium sulfate 2 g/hr (07/16/19 0630)  . naLOXone [facility]) adult infusion for PRURITIS    . oxytocin Stopped (07/16/19 0451)   PRN Meds:.acetaminophen, coconut oil, witch hazel-glycerin **AND** dibucaine, diphenhydrAMINE **OR** diphenhydrAMINE, diphenhydrAMINE, HYDROmorphone (DILAUDID) injection, menthol-cetylpyridinium, nalbuphine **OR** nalbuphine, nalbuphine **OR** nalbuphine, naloxone **AND** sodium chloride flush, naLOXone (NARCAN) adult infusion for PRURITIS, ondansetron (ZOFRAN) IV, oxyCODONE, simethicone, zolpidem General alert and oriented Lung CTAB Car RRR Abdomen is soft and non tender Bandage clean and dry   POD # 1  Doing well Discontinue Magnesium at 8 pm today Start po Labetalol 100 mg po bid today  Patient informed me that she was on  po Labetalol when she first discovered she was pregnant - tolerated it fine  There may be some underlying Chronic Hypertension Anticipate discharge home on Sunday

## 2019-07-16 NOTE — Lactation Note (Addendum)
This note was copied from a baby's chart. Lactation Consultation Note  Patient Name: Regina Higgins Date: 07/16/2019 Reason for consult: Initial assessment;Early term 37-38.6wks;Other (Comment)(hx breast procedure)  Initial visit with P2 mom who delivered @ 37.4wks via C/S, baby is now 64 hours old with 3% wt loss  LC entered room to find RN at bedside completing morning assessment. Infant in bed cuing and mom states baby is ready to feed. Offered to assist and mom agrees.  Mom was unable to latch first son who was preterm; supplemented with formula early. Mom exclusively pumped for 8 weeks and was able to produce about half of what that baby needed as far as volume. Mom also reports history of breast procedure in 2009 when lump was removed from right breast. Incisional scar noted on edge of right areola from about 12 o'clock to 3 o'clock position. Mom states this breast has leaked more during her pregnancy and she gets more colostrum out with hand expression with this breast as well.   Baby with wet diaper, changed by [place]. Reviewed ways to wake a sleepy baby or keep baby awake during a feeding including STS, changing diaper, foot massage, and hand expression and allowing baby to lick/suckle off mom's clean finger.  Reviewed hand expression and breast massage prior to latching and mom with excellent technique with hand expression. Drops easily expressed. Mom states so far infant has latched the bed while lying prone in laid-back position. Reviewed other positions with mom and recommended cross-cradle or football hold positions to have more control of infant's head and support the baby's body. Also reviewed infant may not be able to sustain latch due to lack of head control and ability to coordinate breathing during feedings. Infant repositioned on left breast in cross-cradle hold.  Reviewed latch techniques to achieve deep latch. Mom with soft compressible breast tissue and compressible  areola. Discussed waiting for baby to open wide and "rock" on to the nipple/areola with the bottom lip touching first. Reviewed ensuring flanged lips, wide angle of baby's mouth, and having nose/chin touching the breast during the feeding. Discussed that if baby is positioned correctly at the breast, mom does not need to compress breast tissue to allow infant to breathe. Reviewed alternating breasts each feed and infant switched to right breast. Mom able to latch infant independently in cross cradle position. Infant suckling and audible swallows noted.  Mom has Lansinoh and Spectra DEBP at home. DEBP kit unopened at bedside. Discussed strong possibility of mom needing to pump if infant unable to latch consistently r/t mom's history of breast procedure. Reviewed expected infant behaviors in first 24 hours.  Reviewed feeding baby 8-12 times in 24 hours and with feeding cues. Feeding cues discussed. Notified mom of IP/OP lactation services and support group information on [email or web address], brochure with lactation phone numbers provided. Mom given information about [email or web address] website and encouraged to review. Mom very receptive to teaching and engaged in learning. FOB asleep at bedside. LC left with baby still feeding on right breast.   Maternal Data Has patient been taught Hand Expression?: Yes Does the patient have breastfeeding experience prior to this delivery?: Yes  Feeding Feeding Type: Breast Fed  LATCH Score Latch: Grasps breast easily, tongue down, lips flanged, rhythmical sucking.  Audible Swallowing: Spontaneous and intermittent  Type of Nipple: Everted at rest and after stimulation  Comfort (Breast/Nipple): Soft / non-tender  Hold (Positioning): Assistance needed to correctly position infant at breast and maintain latch.  LATCH Score:  9  Interventions Interventions: Breast feeding basics reviewed;Assisted with latch;Skin to skin;Breast massage;Hand express;Adjust  position;Support pillows;Position options  Lactation Tools Discussed/Used WIC Program: No   Consult Status Consult Status: Follow-up Date: 07/17/19 Follow-up type: In-patient    Cranston Neighbor 07/16/2019, 8:20 AM

## 2019-07-16 NOTE — Progress Notes (Signed)
Called to room by family secondary to patient feeling dizzy while attempting to void.  Ammonia salts needed & pt safely escorted to bed via stedy.  Pt was noted to be pale with color returning rapidly once returned to bed.  Pt did state that beared down on several occasions (approx 6 times total) to produce approx 768ml of urine.  Pt instructed not to bear down anymore.

## 2019-07-17 NOTE — Progress Notes (Signed)
POD # 2 C Section/Severe Preeclampsia  S:  Patient is doing ok.  She is reporting gas pain.  Getting ready to eat breakfast.  O: BP (!) 147/79 (BP Location: Left Arm)   Pulse 86   Temp 98.1 F (36.7 C) (Oral)   Resp 18   Ht 5\' 5"  (1.651 m)   Wt 122 kg Comment: weight from office visit on 11/2  SpO2 100%   Breastfeeding Unknown   BMI 44.76 kg/m  No results found for this or any previous visit (from the past 24 hour(s)). Abdomen is soft and non tender Bandage is clean and dry and intact  POD # 2  Doing well Routine care Ambulate Discharge home tomorrow

## 2019-07-17 NOTE — Lactation Note (Signed)
This note was copied from a baby's chart. Lactation Consultation Note  Patient Name: Girl Shanikia [facility] OVANV'B Date: 07/17/2019 Reason for consult: Follow-up assessment;Early term 37-38.6wks;Infant weight loss;Other (Comment)(Hx of breast procedure)  Visited with mom of a 63 hours old ETI female who is being exclusively BF. Per RN Delsa Sale mom and baby are doing well, her last LATCH score is 10. Mom is not very experienced BF, but when LC entered the room she was very happy to report BF is going well, and that this baby latches "like no one else", praised her for her efforts.  Offered assistance with latch but mom politely declined, baby was asleep, but she asked if LC could observe another feeding. LC to come back around 3:30 pm to observe a feeding. Mom still had her DEBP kit unopened, she's been only using her hand pump but not her DEBP. [place] set up pump, instructions, cleaning and storage were reviewed as well as milk storage guidelines.   Mom also has coconut oil and she's using it already. Reviewed normal newborn behavior, feeding cues, cluster feeding, lactogenesis II and pumping schedule. Parents very engaged during [facility] consultation and had some questions, mom reported an undersupply with her first baby, she told LC she'd only pump 50% of what baby needed (he never latched) and had to supplement with formula. Mom has lots of colostrum this time, she hand expressed for [facility] and it was just pouring off her breasts.  Feeding plan:   1. Encouraged mom to keep feeding baby 8-12 times/24 hours or sooner if feeding cues are present 2. She'll start pumping every 3 hours and will offer any amount of EBM she may get to baby, will try spoon feeding  Parents reported all questions and concerns were answered, they're both aware of [facility] OP services and will work with [facility] on the next feeding at 3:30 pm.  Maternal Data    Feeding Feeding Type: Breast Fed  LATCH Score                    Interventions Interventions: Breast feeding basics reviewed;DEBP;Coconut oil  Lactation Tools Discussed/Used Tools: Pump;Coconut oil;Flanges Flange Size: 24 Breast pump type: Double-Electric Breast Pump Pump Review: Setup, frequency, and cleaning;Milk Storage Initiated by:: MPeck Date initiated:: 07/17/19   Consult Status Consult Status: Follow-up Date: 07/18/19 Follow-up type: In-patient    Dartanyan Deasis [name] 07/17/2019, 1:15 PM

## 2019-07-17 NOTE — Lactation Note (Signed)
This note was copied from a baby's chart. Lactation Consultation Note  Patient Name: Regina Higgins'F Date: 07/17/2019 Reason for consult: Follow-up assessment;Early term 37-38.6wks;Infant weight loss;Other (Comment)(Hx of breast procedure)  LC came back to the room to assist with latch but baby was sleeping, mom told LC that baby had already fed in between but she was willing to wake her up to feed. Parents agreed on placing baby STS, dad removed her clothes after changing baby's diaper; she had a stool, and handed baby to mom for a feeding.  Mom latched baby on the right breast first in cross cradle positron but baby kept breaking the latch just after a few attempts, she would suck on and off and then start crying. Noticed that baby had a stuffy nose, mom tried using the bulb syringe but it didn't help.   She then tried switching to the other breast in the same cross cradle position (per mom baby doesn't like football) and baby did the exact same thing before falling asleep at the breast in a couple of minutes or so. Documented attempt on Flowsheets and asked mom to call for assistance when needed.   Reviewed pumping schedule again and pacifier use. Explained to parents that baby is just doing non-nutritive sucking when sucking on a paci and that could be hiding feeding cues and accentuating weight loss, it's already at 7%. Mom is committed to start double pumping today.   Feeding plan:   1. Encouraged mom to keep feeding baby 8-12 times/24 hours or sooner if feeding cues are present 2. She'll start pumping every 3 hours and will offer any amount of EBM she may get to baby, will try spoon feeding  Parents reported all questions and concerns were answered, they're both aware of [place] OP services and will work with [facility] on the next feeding at 3:30 pm.  Maternal Data    Feeding Feeding Type: Breast Fed  LATCH Score                   Interventions Interventions: Breast  feeding basics reviewed;Assisted with latch;Skin to skin;Breast massage;Hand express;Breast compression;Adjust position;Support pillows  Lactation Tools Discussed/Used Tools: Pump;Coconut oil;Flanges Flange Size: 24 Breast pump type: Double-Electric Breast Pump Pump Review: Setup, frequency, and cleaning;Milk Storage Initiated by:: MPeck Date initiated:: 07/17/19   Consult Status Consult Status: Follow-up Date: 07/18/19 Follow-up type: In-patient    [name] [name] 07/17/2019, 4:16 PM

## 2019-07-18 MED ORDER — OXYCODONE HCL 5 MG PO TABS: 5.0000 mg | ORAL_TABLET | ORAL | 0 refills | Status: AC | PRN

## 2019-07-18 MED ORDER — LABETALOL HCL 100 MG PO TABS: 100.0000 mg | ORAL_TABLET | Freq: Two times a day (BID) | ORAL | 0 refills | Status: AC

## 2019-07-18 NOTE — Progress Notes (Signed)
Discharge instructions and prescriptions given to pt. Discussed post c-section care, signs and symptoms to report to the MD, upcoming appointments, and meds. Pt verbalizes understanding and has no questions or concerns at this time. Pt discharged with baby in stable condition.

## 2019-07-18 NOTE — Discharge Instructions (Signed)
Preeclampsia and Eclampsia °Preeclampsia is a serious condition that may develop during pregnancy. This condition causes high blood pressure and increased protein in your urine along with other symptoms, such as headaches and vision changes. These symptoms may develop as the condition gets worse. Preeclampsia may occur at 20 weeks of pregnancy or later. °Diagnosing and treating preeclampsia early is very important. If not treated early, it can cause serious problems for you and your baby. One problem it can lead to is eclampsia. Eclampsia is a condition that causes muscle jerking or shaking (convulsions or seizures) and other serious problems for the mother. During pregnancy, delivering your baby may be the best treatment for preeclampsia or eclampsia. For most women, preeclampsia and eclampsia symptoms go away after giving birth. °In rare cases, a woman may develop preeclampsia after giving birth (postpartum preeclampsia). This usually occurs within 48 hours after childbirth but may occur up to 6 weeks after giving birth. °What are the causes? °The cause of preeclampsia is not known. °What increases the risk? °The following risk factors make you more likely to develop preeclampsia: °· Being pregnant for the first time. °· Having had preeclampsia during a past pregnancy. °· Having a family history of preeclampsia. °· Having high blood pressure. °· Being pregnant with more than one baby. °· Being 35 or older. °· Being African-American. °· Having kidney disease or diabetes. °· Having medical conditions such as lupus or blood diseases. °· Being very overweight (obese). °What are the signs or symptoms? °The most common symptoms are: °· Severe headaches. °· Vision problems, such as blurred or double vision. °· Abdominal pain, especially upper abdominal pain. °Other symptoms that may develop as the condition gets worse include: °· Sudden weight gain. °· Sudden swelling of the hands, face, legs, and feet. °· Severe nausea  and vomiting. °· Numbness in the face, arms, legs, and feet. °· Dizziness. °· Urinating less than usual. °· Slurred speech. °· Convulsions or seizures. °How is this diagnosed? °There are no screening tests for preeclampsia. Your health care provider will ask you about symptoms and check for signs of preeclampsia during your prenatal visits. You may also have tests that include: °· Checking your blood pressure. °· Urine tests to check for protein. Your health care provider will check for this at every prenatal visit. °· Blood tests. °· Monitoring your baby's heart rate. °· Ultrasound. °How is this treated? °You and your health care provider will determine the treatment approach that is best for you. Treatment may include: °· Having more frequent prenatal exams to check for signs of preeclampsia, if you have an increased risk for preeclampsia. °· Medicine to lower your blood pressure. °· Staying in the hospital, if your condition is severe. There, treatment will focus on controlling your blood pressure and the amount of fluids in your body (fluid retention). °· Taking medicine (magnesium sulfate) to prevent seizures. This may be given as an injection or through an IV. °· Taking a low-dose aspirin during your pregnancy. °· Delivering your baby early. You may have your labor started with medicine (induced), or you may have a cesarean delivery. °Follow these instructions at home: °Eating and drinking ° °· Drink enough fluid to keep your urine pale yellow. °· Avoid caffeine. °Lifestyle °· Do not use any products that contain nicotine or tobacco, such as cigarettes and e-cigarettes. If you need help quitting, ask your health care provider. °· Do not use alcohol or drugs. °· Avoid stress as much as possible. Rest and get   plenty of sleep. General instructions  Take over-the-counter and prescription medicines only as told by your health care provider.  When lying down, lie on your left side. This keeps pressure off your  major blood vessels.  When sitting or lying down, raise (elevate) your feet. Try putting some pillows underneath your lower legs.  Exercise regularly. Ask your health care provider what kinds of exercise are best for you.  Keep all follow-up and prenatal visits as told by your health care provider. This is important. How is this prevented? There is no known way of preventing preeclampsia or eclampsia from developing. However, to lower your risk of complications and detect problems early:  Get regular prenatal care. Your health care provider may be able to diagnose and treat the condition early.  Maintain a healthy weight. Ask your health care provider for help managing weight gain during pregnancy.  Work with your health care provider to manage any long-term (chronic) health conditions you have, such as diabetes or kidney problems.  You may have tests of your blood pressure and kidney function after giving birth.  Your health care provider may have you take low-dose aspirin during your next pregnancy. Contact a health care provider if:  You have symptoms that your health care provider told you may require more treatment or monitoring, such as: ? Headaches. ? Nausea or vomiting. ? Abdominal pain. ? Dizziness. ? Light-headedness. Get help right away if:  You have severe: ? Abdominal pain. ? Headaches that do not get better. ? Dizziness. ? Vision problems. ? Confusion. ? Nausea or vomiting.  You have any of the following: ? A seizure. ? Sudden, rapid weight gain. ? Sudden swelling in your hands, ankles, or face. ? Trouble moving any part of your body. ? Numbness in any part of your body. ? Trouble speaking. ? Abnormal bleeding.  You faint. Summary  Preeclampsia is a serious condition that may develop during pregnancy.  This condition causes high blood pressure and increased protein in your urine along with other symptoms, such as headaches and vision  changes.  Diagnosing and treating preeclampsia early is very important. If not treated early, it can cause serious problems for you and your baby.  Get help right away if you have symptoms that your health care provider told you to watch for. This information is not intended to replace advice given to you by your health care provider. Make sure you discuss any questions you have with your health care provider. Document Released: 08/23/2000 Document Revised: 04/28/2018 Document Reviewed: 04/01/2016 Elsevier Patient Education  2020 [employer] Postpartum Care After Cesarean Delivery This sheet gives you information about how to care for yourself from the time you deliver your baby to up to 6-12 weeks after delivery (postpartum period). Your health care provider may also give you more specific instructions. If you have problems or questions, contact your health care provider. Follow these instructions at home: Medicines  Take over-the-counter and prescription medicines only as told by your health care provider.  If you were prescribed an antibiotic medicine, take it as told by your health care provider. Do not stop taking the antibiotic even if you start to feel better.  Ask your health care provider if the medicine prescribed to you: ? Requires you to avoid driving or using heavy machinery. ? Can cause constipation. You may need to take actions to prevent or treat constipation, such as:  Drink enough fluid to keep your urine pale yellow.  Take over-the-counter or  prescription medicines.  Eat foods that are high in fiber, such as beans, whole grains, and fresh fruits and vegetables.  Limit foods that are high in fat and processed sugars, such as fried or sweet foods. Activity  Gradually return to your normal activities as told by your health care provider.  Avoid activities that take a lot of effort and energy (are strenuous) until approved by your health care provider. Walking at a  slow to moderate pace is usually safe. Ask your health care provider what activities are safe for you. ? Do not lift anything that is heavier than your baby or 10 lb (4.5 kg) as told by your health care provider. ? Do not vacuum, climb stairs, or drive a car for as long as told by your health care provider.  If possible, have someone help you at home until you are able to do your usual activities yourself.  Rest as much as possible. Try to rest or take naps while your baby is sleeping. Vaginal bleeding  It is normal to have vaginal bleeding (lochia) after delivery. Wear a sanitary pad to absorb vaginal bleeding and discharge. ? During the first week after delivery, the amount and appearance of lochia is often similar to a menstrual period. ? Over the next few weeks, it will gradually decrease to a dry, yellow-brown discharge. ? For most women, lochia stops completely by 4-6 weeks after delivery. Vaginal bleeding can vary from woman to woman.  Change your sanitary pads frequently. Watch for any changes in your flow, such as: ? A sudden increase in volume. ? A change in color. ? Large blood clots.  If you pass a blood clot, save it and call your health care provider to discuss. Do not flush blood clots down the toilet before you get instructions from your health care provider.  Do not use tampons or douches until your health care provider says this is safe.  If you are not breastfeeding, your period should return 6-8 weeks after delivery. If you are breastfeeding, your period may return anytime between 8 weeks after delivery and the time that you stop breastfeeding. Perineal care   If your C-section (Cesarean section) was unplanned, and you were allowed to labor and push before delivery, you may have pain, swelling, and discomfort of the tissue between your vaginal opening and your anus (perineum). You may also have an incision in the tissue (episiotomy) or the tissue may have torn during  delivery. Follow these instructions as told by your health care provider: ? Keep your perineum clean and dry as told by your health care provider. Use medicated pads and pain-relieving sprays and creams as directed. ? If you have an episiotomy or vaginal tear, check the area every day for signs of infection. Check for:  Redness, swelling, or pain.  Fluid or blood.  Warmth.  Pus or a bad smell. ? You may be given a squirt bottle to use instead of wiping to clean the perineum area after you go to the bathroom. As you start healing, you may use the squirt bottle before wiping yourself. Make sure to wipe gently. ? To relieve pain caused by an episiotomy, vaginal tear, or hemorrhoids, try taking a warm sitz bath 2-3 times a day. A sitz bath is a warm water bath that is taken while you are sitting down. The water should only come up to your hips and should cover your buttocks. Breast care  Within the first few days after delivery, your  breasts may feel heavy, full, and uncomfortable (breast engorgement). You may also have milk leaking from your breasts. Your health care provider can suggest ways to help relieve breast discomfort. Breast engorgement should go away within a few days.  If you are breastfeeding: ? Wear a bra that supports your breasts and fits you well. ? Keep your nipples clean and dry. Apply creams and ointments as told by your health care provider. ? You may need to use breast pads to absorb milk leakage. ? You may have uterine contractions every time you breastfeed for several weeks after delivery. Uterine contractions help your uterus return to its normal size. ? If you have any problems with breastfeeding, work with your health care provider or a Advertising copywriterlactation consultant.  If you are not breastfeeding: ? Avoid touching your breasts as this can make your breasts produce more milk. ? Wear a well-fitting bra and use cold packs to help with swelling. ? Do not squeeze out (express)  milk. This causes you to make more milk. Intimacy and sexuality  Ask your health care provider when you can engage in sexual activity. This may depend on your: ? Risk of infection. ? Healing rate. ? Comfort and desire to engage in sexual activity.  You are able to get pregnant after delivery, even if you have not had your period. If desired, talk with your health care provider about methods of family planning or birth control (contraception). Lifestyle  Do not use any products that contain nicotine or tobacco, such as cigarettes, e-cigarettes, and chewing tobacco. If you need help quitting, ask your health care provider.  Do not drink alcohol, especially if you are breastfeeding. Eating and drinking   Drink enough fluid to keep your urine pale yellow.  Eat high-fiber foods every day. These may help prevent or relieve constipation. High-fiber foods include: ? Whole grain cereals and breads. ? Brown rice. ? Beans. ? Fresh fruits and vegetables.  Take your prenatal vitamins until your postpartum checkup or until your health care provider tells you it is okay to stop. General instructions  Keep all follow-up visits for you and your baby as told by your health care provider. Most women visit their health care provider for a postpartum checkup within the first 3-6 weeks after delivery. Contact a health care provider if you:  Feel unable to cope with the changes that a new baby brings to your life, and these feelings do not go away.  Feel unusually sad or worried.  Have breasts that are painful, hard, or turn red.  Have a fever.  Have trouble holding urine or keeping urine from leaking.  Have little or no interest in activities you used to enjoy.  Have not breastfed at all and you have not had a menstrual period for 12 weeks after delivery.  Have stopped breastfeeding and you have not had a menstrual period for 12 weeks after you stopped breastfeeding.  Have questions about  caring for yourself or your baby.  Pass a blood clot from your vagina. Get help right away if you:  Have chest pain.  Have difficulty breathing.  Have sudden, severe leg pain.  Have severe pain or cramping in your abdomen.  Bleed from your vagina so much that you fill more than one sanitary pad in one hour. Bleeding should not be heavier than your heaviest period.  Develop a severe headache.  Faint.  Have blurred vision or spots in your vision.  Have a bad-smelling vaginal discharge.  Have thoughts about hurting yourself or your baby. If you ever feel like you may hurt yourself or others, or have thoughts about taking your own life, get help right away. You can go to your nearest emergency department or call:  Your local emergency services (911 in the [place]  A suicide crisis helpline, such as the National Suicide Prevention Lifeline at 870-832-4473. This is open 24 hours a day. Summary  The period of time from when you deliver your baby to up to 6-12 weeks after delivery is called the postpartum period.  Gradually return to your normal activities as told by your health care provider.  Keep all follow-up visits for you and your baby as told by your health care provider. This information is not intended to replace advice given to you by your health care provider. Make sure you discuss any questions you have with your health care provider. Document Released: 08/23/2000 Document Revised: 04/15/2018 Document Reviewed: 04/15/2018 Elsevier Patient Education  2020 [employer].

## 2019-07-18 NOTE — Lactation Note (Signed)
This note was copied from a baby's chart. Lactation Consultation Note  Patient Name: Girl Nimco Bivens [id number] Date: 07/18/2019 Reason for consult: Follow-up assessment;Infant weight loss;Other (Comment);Early term 37-38.6wks(Hx of breast procedure)  Mom and baby are discharged today, baby is at 9% weight loss. Mom has already left by the time LC came to the room but spoke to [employer] and she gave mom another LATCH score of 10 and states baby is transferring really well. Mom has BF brochure with contact numbers in case any questions or concerns arise.  Maternal Data    Feeding Feeding Type: Breast Fed  LATCH Score Latch: Grasps breast easily, tongue down, lips flanged, rhythmical sucking.  Audible Swallowing: Spontaneous and intermittent  Type of Nipple: Everted at rest and after stimulation  Comfort (Breast/Nipple): Soft / non-tender  Hold (Positioning): No assistance needed to correctly position infant at breast.  LATCH Score: 10  Interventions    Lactation Tools Discussed/Used     Consult Status Consult Status: Complete Date: 07/18/19 Follow-up type: Call as needed    Murray 07/18/2019, 11:24 AM

## 2019-07-18 NOTE — Discharge Summary (Signed)
Obstetric Discharge Summary Reason for Admission: cesarean section Prenatal Procedures: Preeclampsia Intrapartum Procedures: cesarean: low cervical, transverse Postpartum Procedures: none Complications-Operative and Postpartum: none Hemoglobin  Date Value Ref Range Status  07/16/2019 10.8 (L) 12.0 - 15.0 g/dL Final   HCT  Date Value Ref Range Status  07/16/2019 32.4 (L) 36.0 - 46.0 % Final    Physical Exam:  General: alert, cooperative and appears stated age 28: appropriate Uterine Fundus: firm Incision: healing well, no significant drainage, no dehiscence, no significant erythema DVT Evaluation: No evidence of DVT seen on physical exam.  Discharge Diagnoses: Term Pregnancy-delivered  Discharge Information: Date: 07/18/2019 Activity: pelvic rest Diet: routine Medications: oxycodone Condition: improved Instructions: refer to practice specific booklet Discharge to: home   Newborn Data: Live born female  Birth Weight: 7 lb 7.1 oz (3375 g) APGAR: 8, 9  Newborn Delivery   Birth date/time: 07/15/2019 19:10:00 Delivery type: C-Section, Low Transverse Trial of labor: No C-section categorization: Repeat      Home with mother.  Regina Higgins 07/18/2019, 9:20 AM

## 2019-07-19 LAB — SURGICAL PATHOLOGY

## 2019-07-20 ENCOUNTER — Ambulatory Visit: Payer: Self-pay | Admitting: *Deleted

## 2019-07-20 NOTE — Telephone Encounter (Signed)
Pt had a c/s  on Thursday. And she noticed the hemorrhoid when taking a shower before leaving the hospital.  She stated that she was given Tucks, and medication for the hemorrhoid before leaving.  She called her OB GYN and was told to use hydrocortisone cream and use Preparation H. She is also advised to try a sitz bath and sit towards her side. She voiced understanding. She has an appointment scheduled for Monday with that practice. She denies fever. She is taking colace. Will route to LB at [facility] for review.   Reason for Disposition . [1] Home treatment > 3 days for rectal pain AND [2] not improved  Answer Assessment - Initial Assessment Questions 1. SYMPTOM:  "What's the main symptom you're concerned about?" (e.g., pain, itching, swelling, rash)     hemorrhoids 2. ONSET: "When did the hemorrhoids start?"     Before leaving the hospital after her c/s 3. RECTAL PAIN: "Do you have any pain around your rectum?" "How bad is the pain?"  (Scale 1-10; or mild, moderate, severe)  - MILD (1-3): doesn't interfere with normal activities   - MODERATE (4-7): interferes with normal activities or awakens from sleep, limping   - SEVERE (8-10): excruciating pain, unable to have a bowel movement      moderate 4. RECTAL ITCHING: "Do you have any itching in this area?" "How bad is the itching?"  (Scale 1-10; or mild, moderate, severe)  - MILD - doesn't interfere with normal activities   - MODERATE-SEVERE: interferes with normal activities or awakens from sleep     no 5. CONSTIPATION: "Do you have constipation?" If so, "How bad is it?"     no 6. CAUSE: "What do you think is causing the anus symptoms?"     hemorrhoid 7. OTHER SYMPTOMS: "Do you have any other symptoms?"  (e.g., rectal bleeding, abdominal pain, vomiting, fever)     no 8. PREGNANCY: "Is there any chance you are pregnant?" "When was your last menstrual period?"     No. Just had c/s  Protocols used: RECTAL [facility]

## 2019-07-21 ENCOUNTER — Other Ambulatory Visit (HOSPITAL_COMMUNITY)
Admission: RE | Admit: 2019-07-21 | Discharge: 2019-07-21 | Disposition: A | Discharge status: Home / Self Care | Payer: BC Managed Care – PPO | Source: Ambulatory Visit | Attending: Obstetrics & Gynecology | Admitting: Obstetrics & Gynecology

## 2019-07-21 HISTORY — DX: Personal history of other complications of pregnancy, childbirth and the puerperium: Z87.59

## 2019-07-21 NOTE — Telephone Encounter (Signed)
I called and offered patient appointment but for now she declined. She stated that she would just f/u with OBGYN & if needed she would call back for appointment,

## 2019-07-21 NOTE — Telephone Encounter (Signed)
She can do appt with [place] if she wishes, but everything the hospital/OB GYN advised as well as the triage nurse advised is everything I would suggest as well  Hemorrhoids are not uncommon especially with pregnancy

## 2019-07-23 ENCOUNTER — Encounter (HOSPITAL_COMMUNITY): Admission: RE | Payer: Self-pay | Source: Home / Self Care

## 2019-07-23 ENCOUNTER — Inpatient Hospital Stay (HOSPITAL_COMMUNITY)
Admission: RE | Admit: 2019-07-23 | Payer: BC Managed Care – PPO | Source: Home / Self Care | Admitting: Obstetrics & Gynecology

## 2019-07-23 SURGERY — Surgical Case
Anesthesia: Regional | Laterality: Bilateral

## 2019-07-29 DIAGNOSIS — O9089 Other complications of the puerperium, not elsewhere classified: Secondary | ICD-10-CM | POA: Diagnosis not present

## 2019-08-26 DIAGNOSIS — Z1389 Encounter for screening for other disorder: Secondary | ICD-10-CM | POA: Diagnosis not present

## 2020-08-10 DIAGNOSIS — R070 Pain in throat: Secondary | ICD-10-CM | POA: Diagnosis not present

## 2020-08-10 DIAGNOSIS — R059 Cough, unspecified: Secondary | ICD-10-CM | POA: Diagnosis not present

## 2020-08-10 DIAGNOSIS — J069 Acute upper respiratory infection, unspecified: Secondary | ICD-10-CM | POA: Diagnosis not present

## 2020-10-03 DIAGNOSIS — U071 COVID-19: Secondary | ICD-10-CM | POA: Diagnosis not present

## 2020-10-03 DIAGNOSIS — R509 Fever, unspecified: Secondary | ICD-10-CM | POA: Diagnosis not present

## 2021-03-16 DIAGNOSIS — R030 Elevated blood-pressure reading, without diagnosis of hypertension: Secondary | ICD-10-CM | POA: Diagnosis not present

## 2021-03-16 DIAGNOSIS — Z8759 Personal history of other complications of pregnancy, childbirth and the puerperium: Secondary | ICD-10-CM | POA: Diagnosis not present

## 2021-03-16 DIAGNOSIS — Z1151 Encounter for screening for human papillomavirus (HPV): Secondary | ICD-10-CM | POA: Diagnosis not present

## 2021-03-16 DIAGNOSIS — Z01419 Encounter for gynecological examination (general) (routine) without abnormal findings: Secondary | ICD-10-CM | POA: Diagnosis not present

## 2021-03-16 DIAGNOSIS — Z124 Encounter for screening for malignant neoplasm of cervix: Secondary | ICD-10-CM | POA: Diagnosis not present
